# Patient Record
Sex: Male | Born: 1985
Health system: Southern US, Community
[De-identification: ages and names within clinical notes are randomized; demographics above are authoritative.]

## PROBLEM LIST (undated history)

## (undated) DIAGNOSIS — J301 Allergic rhinitis due to pollen: Secondary | ICD-10-CM

## (undated) HISTORY — DX: Allergic rhinitis due to pollen: J30.1

---

## 2006-03-02 ENCOUNTER — Emergency Department: Payer: Self-pay

## 2007-06-11 ENCOUNTER — Other Ambulatory Visit: Payer: Self-pay

## 2007-06-11 ENCOUNTER — Emergency Department: Payer: Self-pay | Admitting: Emergency Medicine

## 2007-10-12 ENCOUNTER — Emergency Department: Payer: Self-pay | Admitting: Emergency Medicine

## 2008-08-14 ENCOUNTER — Emergency Department: Payer: Self-pay | Admitting: Emergency Medicine

## 2008-12-22 ENCOUNTER — Emergency Department: Payer: Self-pay | Admitting: Internal Medicine

## 2009-12-28 ENCOUNTER — Emergency Department: Payer: Self-pay | Admitting: Internal Medicine

## 2010-01-01 ENCOUNTER — Emergency Department: Payer: Self-pay | Admitting: Emergency Medicine

## 2012-12-21 ENCOUNTER — Emergency Department: Payer: Self-pay | Admitting: Emergency Medicine

## 2013-05-15 ENCOUNTER — Emergency Department: Payer: Self-pay | Admitting: Emergency Medicine

## 2017-07-17 ENCOUNTER — Other Ambulatory Visit: Payer: Self-pay

## 2017-07-17 ENCOUNTER — Encounter: Payer: Self-pay | Admitting: Emergency Medicine

## 2017-07-17 ENCOUNTER — Emergency Department
Admission: EM | Admit: 2017-07-17 | Discharge: 2017-07-17 | Disposition: A | Discharge status: Home / Self Care | Payer: 59 | Attending: Emergency Medicine | Admitting: Emergency Medicine

## 2017-07-17 DIAGNOSIS — B354 Tinea corporis: Secondary | ICD-10-CM | POA: Diagnosis not present

## 2017-07-17 DIAGNOSIS — L0231 Cutaneous abscess of buttock: Secondary | ICD-10-CM | POA: Diagnosis not present

## 2017-07-17 DIAGNOSIS — R21 Rash and other nonspecific skin eruption: Secondary | ICD-10-CM | POA: Diagnosis present

## 2017-07-17 MED ORDER — TERBINAFINE HCL 1 % EX CREA: 1.0000 "application " | TOPICAL_CREAM | Freq: Two times a day (BID) | CUTANEOUS | 0 refills | Status: DC

## 2017-07-17 MED ORDER — SULFAMETHOXAZOLE-TRIMETHOPRIM 800-160 MG PO TABS: 1.0000 | ORAL_TABLET | Freq: Two times a day (BID) | ORAL | 0 refills | Status: DC

## 2017-07-17 MED ORDER — HYDROCODONE-ACETAMINOPHEN 5-325 MG PO TABS: 1.0000 | ORAL_TABLET | Freq: Four times a day (QID) | ORAL | 0 refills | Status: DC | PRN

## 2017-07-17 NOTE — Discharge Instructions (Signed)
Begin taking antibiotics today twice a day for the next 10 days.  Take pain medication as needed every 6 hours.  Do not take this pain medication and drive as it may cause drowsiness.  Also the Lamisil 1% cream applied to area twice a day.  It may take several weeks before you start seeing this rash begin to fade away.  If not improving you will need to see 1 of the dermatologist at [facility] for continued therapy.

## 2017-07-17 NOTE — ED Provider Notes (Signed)
[facility] Emergency Department Provider Note  ____________________________________________   First MD Initiated Contact with Patient 07/17/17 1326     (approximate)  I have reviewed the triage vital signs and the nursing notes.   HISTORY  Chief Complaint Abscess and Tinea   HPI Harry Johnson is a 32 y.o. male is here with complaint of a "ringworm" on his left wrist.  Patient states it is been there for quite a while and he has been using cortisone cream to the area without any relief.  Patient also is here to have a "boil" looked out on his buttocks.  He states this began recently and has increased in pain.  He has been using warm compresses to the area without any improvement.  He denies any fever or chills.  As his pain is a 10/10.  History reviewed. No pertinent past medical history.  There are no active problems to display for this patient.   History reviewed. No pertinent surgical history.  Prior to Admission medications   Medication Sig Start Date End Date Taking? Authorizing Provider  HYDROcodone-acetaminophen (NORCO/VICODIN) 5-325 MG tablet Take 1 tablet by mouth every 6 (six) hours as needed for moderate pain. 07/17/17   Tommi Rumps, PA-C  sulfamethoxazole-trimethoprim (BACTRIM DS,SEPTRA DS) 800-160 MG tablet Take 1 tablet by mouth 2 (two) times daily. 07/17/17   Tommi Rumps, PA-C  terbinafine (LAMISIL) 1 % cream Apply 1 application topically 2 (two) times daily. 07/17/17   Tommi Rumps, PA-C    Allergies Patient has no known allergies.  No family history on file.  Social History Social History   Tobacco Use  . Smoking status: Never Smoker  . Smokeless tobacco: Never Used  Substance Use Topics  . Alcohol use: Yes  . Drug use: Not on file    Review of Systems Constitutional: No fever/chills Cardiovascular: Denies chest pain. Respiratory: Denies shortness of breath. Gastrointestinal: No abdominal pain.  No nausea,  no vomiting.  Musculoskeletal: Negative for back pain. Skin: Positive for rash.  Positive for abscess. Neurological: Negative for headaches, focal weakness or numbness. ____________________________________________   PHYSICAL EXAM:  VITAL SIGNS: ED Triage Vitals  Enc Vitals Group     BP 07/17/17 1228 100/79     Pulse Rate 07/17/17 1228 83     Resp 07/17/17 1228 14     Temp 07/17/17 1228 98.4 F (36.9 C)     Temp Source 07/17/17 1228 Oral     SpO2 07/17/17 1228 100 %     Weight 07/17/17 1223 188 lb (85.3 kg)     Height 07/17/17 1223 5\' 8"  (1.727 m)     Head Circumference --      Peak Flow --      Pain Score 07/17/17 1223 10     Pain Loc --      Pain Edu? --      Excl. in GC? --     Constitutional: Alert and oriented. Well appearing and in no acute distress. Eyes: Conjunctivae are normal.  Head: Atraumatic. Neck: No stridor.   Cardiovascular: Normal rate, regular rhythm. Grossly normal heart sounds.  Good peripheral circulation. Respiratory: Normal respiratory effort.  No retractions. Lungs CTAB. Musculoskeletal: No lower extremity tenderness nor edema.  No joint effusions. Neurologic:  Normal speech and language. No gross focal neurologic deficits are appreciated.  Skin:  Skin is warm, dry and intact.  Left wrist volar aspect with macular area with discrete margins and central clearing consistent with  tinea corpus.  Right buttocks with erythema and a 4 cm firm tender nodule.  No drainage is present.  No area of fluctuance was noted during exam. Psychiatric: Mood and affect are normal. Speech and behavior are normal.  ____________________________________________   LABS (all labs ordered are listed, but only abnormal results are displayed)  Labs Reviewed - No data to display ____________________________________________  PROCEDURES  Procedure(s) performed: None  Procedures  Critical Care performed: No  ____________________________________________   INITIAL  IMPRESSION / ASSESSMENT AND PLAN / ED COURSE  As part of my medical decision making, I reviewed the following data within the electronic MEDICAL RECORD NUMBER Notes from prior ED visits and [place] Controlled Substance Database  Patient presents with a rash to his wrist and also a early abscess to his right buttocks.  Patient was given prescription for Lamisil cream to apply twice daily to his wrist.  He is aware that this will take several weeks before he sees improvement.  Also he was placed on antibiotics for an early abscess to his right buttocks that appears cellulitic.  He will begin doing warm compresses to the area frequently.  He was discharged with a prescription for antibiotics and he is to return to the emergency department in 1 to 2 days for possible I&D of this area.  ____________________________________________   FINAL CLINICAL IMPRESSION(S) / ED DIAGNOSES  Final diagnoses:  Abscess of buttock, right  Tinea corporis     ED Discharge Orders        Ordered    sulfamethoxazole-trimethoprim (BACTRIM DS,SEPTRA DS) 800-160 MG tablet  2 times daily     07/17/17 1351    HYDROcodone-acetaminophen (NORCO/VICODIN) 5-325 MG tablet  Every 6 hours PRN     07/17/17 1351    terbinafine (LAMISIL) 1 % cream  2 times daily     07/17/17 1351       Note:  This document was prepared using Dragon voice recognition software and may include unintentional dictation errors.    Tommi RumpsSummers, Benjamen Koelling L, PA-C 07/17/17 1627    Dionne BucySiadecki, Sebastian, MD 07/18/17 [phone]

## 2017-07-17 NOTE — ED Triage Notes (Signed)
Says ringworm on left wrist.  And he thinks he has a boil on his bottom.

## 2017-07-17 NOTE — ED Notes (Signed)
See triage note  Presents with possible ringworm to wrist area  No pain  Also having some pain to buttocks  Possible abscess area  Ambulates well treatment room

## 2017-08-28 ENCOUNTER — Other Ambulatory Visit: Payer: Self-pay

## 2017-08-28 ENCOUNTER — Emergency Department
Admission: EM | Admit: 2017-08-28 | Discharge: 2017-08-28 | Disposition: A | Discharge status: Home / Self Care | Payer: 59 | Attending: Emergency Medicine | Admitting: Emergency Medicine

## 2017-08-28 ENCOUNTER — Encounter: Payer: Self-pay | Admitting: Emergency Medicine

## 2017-08-28 DIAGNOSIS — Z79899 Other long term (current) drug therapy: Secondary | ICD-10-CM | POA: Insufficient documentation

## 2017-08-28 DIAGNOSIS — W2210XA Striking against or struck by unspecified automobile airbag, initial encounter: Secondary | ICD-10-CM | POA: Insufficient documentation

## 2017-08-28 DIAGNOSIS — Y999 Unspecified external cause status: Secondary | ICD-10-CM | POA: Diagnosis not present

## 2017-08-28 DIAGNOSIS — Y939 Activity, unspecified: Secondary | ICD-10-CM | POA: Diagnosis not present

## 2017-08-28 DIAGNOSIS — S50812A Abrasion of left forearm, initial encounter: Secondary | ICD-10-CM | POA: Diagnosis not present

## 2017-08-28 DIAGNOSIS — Y929 Unspecified place or not applicable: Secondary | ICD-10-CM | POA: Insufficient documentation

## 2017-08-28 DIAGNOSIS — S59912A Unspecified injury of left forearm, initial encounter: Secondary | ICD-10-CM | POA: Diagnosis present

## 2017-08-28 MED ORDER — MELOXICAM 15 MG PO TABS: 15.0000 mg | ORAL_TABLET | Freq: Every day | ORAL | 0 refills | Status: DC

## 2017-08-28 MED ORDER — CEPHALEXIN 500 MG PO CAPS: 1000.0000 mg | ORAL_CAPSULE | Freq: Two times a day (BID) | ORAL | 0 refills | Status: DC

## 2017-08-28 MED ORDER — METHOCARBAMOL 500 MG PO TABS: 500.0000 mg | ORAL_TABLET | Freq: Four times a day (QID) | ORAL | 0 refills | Status: DC
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
## 2017-12-29 ENCOUNTER — Other Ambulatory Visit: Payer: Self-pay

## 2017-12-29 ENCOUNTER — Encounter: Payer: Self-pay | Admitting: Intensive Care

## 2017-12-29 ENCOUNTER — Emergency Department
Admission: EM | Admit: 2017-12-29 | Discharge: 2017-12-29 | Disposition: A | Discharge status: Home / Self Care | Payer: 59 | Attending: Emergency Medicine | Admitting: Emergency Medicine

## 2017-12-29 DIAGNOSIS — B349 Viral infection, unspecified: Secondary | ICD-10-CM | POA: Insufficient documentation

## 2017-12-29 DIAGNOSIS — Z79899 Other long term (current) drug therapy: Secondary | ICD-10-CM | POA: Diagnosis not present

## 2017-12-29 DIAGNOSIS — R509 Fever, unspecified: Secondary | ICD-10-CM | POA: Diagnosis present

## 2017-12-29 MED ORDER — OSELTAMIVIR PHOSPHATE 75 MG PO CAPS: 75.0000 mg | ORAL_CAPSULE | Freq: Two times a day (BID) | ORAL | 0 refills | Status: AC

## 2017-12-29 NOTE — Discharge Instructions (Addendum)
Please continue with over-the-counter medications such as Alka-Seltzer to help treat cough, congestion, runny nose.  Take Tamiflu as prescribed.  Make sure you are drinking lots of fluids.  Alternate Tylenol and ibuprofen as needed for body aches and fevers.

## 2017-12-29 NOTE — ED Notes (Signed)
Patient reports feeling very tired and having lower back pain that began yesterday.  Patient states he had a fever and has been having body aches.  Patient states his daughter had the flu.  Patient is in no obvious distress at this time.

## 2017-12-29 NOTE — ED Triage Notes (Signed)
Patient c/o chills/sweats, congestion, and body aches. A&O x4

## 2017-12-29 NOTE — ED Provider Notes (Signed)
[facility] EMERGENCY DEPARTMENT Provider Note   CSN: [id number] Arrival date & time: 12/29/17  1633     History   Chief Complaint No chief complaint on file.   HPI Harry Johnson is a 32 y.o. male.  Presents emergency department evaluation of body aches, fever, cough congestion runny nose.  He denies a sore throat.  Daughter was recently diagnosed with influenza and started on Tamiflu.  He denies any abdominal pain, nausea, vomiting, diarrhea chest pain shortness of breath.  Patient is taken some Alka-Seltzer with mild improvement of body aches.  He denies any urinary symptoms.  HPI  History reviewed. No pertinent past medical history.  There are no active problems to display for this patient.   History reviewed. No pertinent surgical history.      Home Medications    Prior to Admission medications   Medication Sig Start Date End Date Taking? Authorizing Provider  cephALEXin (KEFLEX) 500 MG capsule Take 2 capsules (1,000 mg total) by mouth 2 (two) times daily. 08/28/17   Cuthriell, Delorise RoyalsJonathan D, PA-C  HYDROcodone-acetaminophen (NORCO/VICODIN) 5-325 MG tablet Take 1 tablet by mouth every 6 (six) hours as needed for moderate pain. 07/17/17   Tommi RumpsSummers, Rhonda L, PA-C  meloxicam (MOBIC) 15 MG tablet Take 1 tablet (15 mg total) by mouth daily. 08/28/17   Cuthriell, Delorise RoyalsJonathan D, PA-C  methocarbamol (ROBAXIN) 500 MG tablet Take 1 tablet (500 mg total) by mouth 4 (four) times daily. 08/28/17   Cuthriell, Delorise RoyalsJonathan D, PA-C  oseltamivir (TAMIFLU) 75 MG capsule Take 1 capsule (75 mg total) by mouth 2 (two) times daily for 5 days. 12/29/17 01/03/18  Evon SlackGaines, Josiane Labine C, PA-C  sulfamethoxazole-trimethoprim (BACTRIM DS,SEPTRA DS) 800-160 MG tablet Take 1 tablet by mouth 2 (two) times daily. 07/17/17   Tommi RumpsSummers, Rhonda L, PA-C  terbinafine (LAMISIL) 1 % cream Apply 1 application topically 2 (two) times daily. 07/17/17   Tommi RumpsSummers, Rhonda L, PA-C    Family History History reviewed.  No pertinent family history.  Social History Social History   Tobacco Use  . Smoking status: Never Smoker  . Smokeless tobacco: Never Used  Substance Use Topics  . Alcohol use: Yes  . Drug use: Never     Allergies   Patient has no known allergies.   Review of Systems Review of Systems  Constitutional: Positive for chills and fever.  HENT: Positive for congestion, rhinorrhea and sore throat. Negative for sinus pressure and sinus pain.   Respiratory: Positive for cough. Negative for wheezing and stridor.   Gastrointestinal: Negative for diarrhea, nausea and vomiting.  Genitourinary: Negative for difficulty urinating, discharge and dysuria.  Musculoskeletal: Positive for myalgias. Negative for arthralgias and neck stiffness.  Skin: Negative for rash.  Neurological: Negative for dizziness.     Physical Exam Updated Vital Signs BP 134/64 (BP Location: Left Arm)   Pulse 90   Temp 99.8 F (37.7 C) (Oral)   Resp 14   Ht 5\' 8"  (1.727 m)   Wt 83.9 kg   SpO2 99%   BMI 28.13 kg/m   Physical Exam Constitutional:      General: He is not in acute distress.    Appearance: He is well-developed.  HENT:     Head: Normocephalic and atraumatic.     Jaw: No trismus.     Right Ear: Hearing, tympanic membrane, ear canal and external ear normal.     Left Ear: Hearing, tympanic membrane, ear canal and external ear normal.     Nose:  Rhinorrhea present.     Right Sinus: No maxillary sinus tenderness or frontal sinus tenderness.     Left Sinus: No maxillary sinus tenderness or frontal sinus tenderness.     Mouth/Throat:     Pharynx: No oropharyngeal exudate, posterior oropharyngeal erythema or uvula swelling.     Tonsils: No tonsillar abscesses.  Eyes:     Conjunctiva/sclera: Conjunctivae normal.  Neck:     Musculoskeletal: Normal range of motion.  Cardiovascular:     Rate and Rhythm: Normal rate and regular rhythm.  Pulmonary:     Effort: No respiratory distress.     Breath  sounds: No stridor. No wheezing.  Chest:     Chest wall: No tenderness.  Abdominal:     General: There is no distension.     Palpations: Abdomen is soft.     Tenderness: There is no abdominal tenderness. There is no guarding.  Musculoskeletal: Normal range of motion.  Skin:    General: Skin is warm and dry.     Findings: No rash.  Neurological:     Mental Status: He is alert and oriented to person, place, and time.  Psychiatric:        Behavior: Behavior normal.        Thought Content: Thought content normal.        Judgment: Judgment normal.      ED Treatments / Results  Labs (all labs ordered are listed, but only abnormal results are displayed) Labs Reviewed - No data to display  EKG None  Radiology No results found.  Procedures Procedures (including critical care time)  Medications Ordered in ED Medications - No data to display   Initial Impression / Assessment and Plan / ED Course  I have reviewed the triage vital signs and the nursing notes.  Pertinent labs & imaging results that were available during my care of the patient were reviewed by me and considered in my medical decision making (see chart for details).     32 year old male with influenza-like symptoms.  Daughter was diagnosed with influenza and started on Tamiflu.  Patient's physical exam and history consistent with flulike illness, he forego testing and was okay with starting Tamiflu.  He will continue with over-the-counter medications for symptomatic relief.  Continue with Tylenol and ibuprofen increase fluids.  He understands signs and symptoms return to ED for.  Final Clinical Impressions(s) / ED Diagnoses   Final diagnoses:  Viral illness    ED Discharge Orders         Ordered    oseltamivir (TAMIFLU) 75 MG capsule  2 times daily     12/29/17 1815           Ronnette Juniper 12/29/17 1817    Jeanmarie Plant, MD 12/29/17 2231

## 2018-05-21 ENCOUNTER — Encounter: Payer: Self-pay | Admitting: Internal Medicine

## 2018-05-21 ENCOUNTER — Ambulatory Visit (INDEPENDENT_AMBULATORY_CARE_PROVIDER_SITE_OTHER): Payer: BLUE CROSS/BLUE SHIELD | Admitting: Internal Medicine

## 2018-05-21 ENCOUNTER — Ambulatory Visit: Payer: Managed Care, Other (non HMO)

## 2018-05-21 VITALS — BP 118/80 | Ht 68.0 in | Wt 194.0 lb

## 2018-05-21 DIAGNOSIS — R5383 Other fatigue: Secondary | ICD-10-CM

## 2018-05-21 DIAGNOSIS — F439 Reaction to severe stress, unspecified: Secondary | ICD-10-CM | POA: Diagnosis not present

## 2018-05-21 DIAGNOSIS — J301 Allergic rhinitis due to pollen: Secondary | ICD-10-CM | POA: Insufficient documentation

## 2018-05-21 NOTE — Assessment & Plan Note (Signed)
Not sure if this is serious or not No weight loss, fevers or other worrisome symptoms Will hold off on labs---but check if he worsens

## 2018-05-21 NOTE — Progress Notes (Signed)
Subjective:    Patient ID: Harry Johnson, male    DOB: April 04, 1985, 33 y.o.   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 Some attempts at social distancing--but imperfect. No clear close contact with cases He is stressed by this--has offer from his work to get "COVID time" He is especially concerned due to his 32 year old daughter --was preemie Hopes after some time off--he will be able to work from home He is wearing gloves, mask, etc when he is out--but can't use them at work due to phone set he wears No COVID symptoms  Current Outpatient Medications on File Prior to Visit  Medication Sig Dispense Refill  . cetirizine (ZYRTEC) 10 MG tablet Take 10 mg by mouth daily.     No current facility-administered medications on file prior to visit.     No Known Allergies  Past Medical History:  Diagnosis Date  . Chronic seasonal allergic rhinitis due to pollen     History reviewed. No pertinent surgical history.  Family History  Problem Relation Age of Onset  . Lung disease Mother   . Cancer Neg Hx   . Diabetes Neg Hx   . Heart disease Neg Hx     Social History   Socioeconomic History  . Marital status: Married    Spouse name: Not on file  . Number of children: 1  . Years of education: Not on file  . Highest education level: Not on file  Occupational History  . 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 Financial resource strain: Not on file  . Food insecurity:    Worry: Not on file    Inability: Not on file  . Transportation needs:    Medical: Not on file    Non-medical: Not  on file  Tobacco Use  . Smoking status: Never Smoker  . Smokeless tobacco: Never Used  Substance and Sexual Activity  . Alcohol use: Yes  . Drug use: Never  . Sexual activity: Not on file  Lifestyle  . Physical activity:    Days per week: Not on file    Minutes per session: Not on file  . Stress: Not on file  Relationships  . Social connections:    Talks on phone: Not on file    Gets together: Not on file    Attends religious service: Not on file    Active member of club or organization: Not on file    Attends meetings of clubs or organizations: Not on file    Relationship status: Not on file  . Intimate partner violence:    Fear of current or ex partner: Not on file    Emotionally abused: Not on file    Physically abused: Not on file    Forced sexual activity: Not on file  Other Topics Concern  . Not on file  Social History Narrative   Married    32 year old daughter   32 year old step daughter   Review of Systems  Constitutional: Positive for fatigue. Negative for unexpected weight change.  Feels he wears out quicker  HENT: Negative for dental problem and hearing loss.   Respiratory: Negative for cough, chest tightness and shortness of breath.   Cardiovascular: Negative for chest pain, palpitations and leg swelling.  Gastrointestinal: Negative for abdominal pain and constipation.       Appetite is good No heartburn  Genitourinary: Negative for difficulty urinating, frequency and urgency.  Musculoskeletal: Negative for arthralgias, back pain and joint swelling.  Skin: Negative for rash.  Allergic/Immunologic: Positive for environmental allergies. Negative for immunocompromised state.  Neurological: Negative for dizziness, syncope, light-headedness and headaches.  Hematological: Negative for adenopathy. Does not bruise/bleed easily.  Psychiatric/Behavioral: Negative for dysphoric mood and sleep disturbance. The patient is not nervous/anxious.        Objective:    Physical Exam  Constitutional: He appears well-developed. No distress.  Respiratory: Effort normal. No respiratory distress.  Psychiatric: He has a normal mood and affect. His behavior is normal.           Assessment & Plan:

## 2018-05-21 NOTE — Assessment & Plan Note (Signed)
There has been COVID at his office and he is very worried about bringing it home to his formerly preemie daughter He is allowed COVID time through his job---- I will write letter suggesting 2-3 weeks off from the office and hope that he can transition to working from home soon. He will get back to me with specifics about the letter

## 2018-05-22 ENCOUNTER — Encounter: Payer: Self-pay | Admitting: Internal Medicine

## 2018-05-22 NOTE — Telephone Encounter (Signed)
Please find out if he wants this mailed or if he will pick it up No charge

## 2018-06-10 NOTE — Telephone Encounter (Signed)
You need to send an in basket (staff message) to the [facility] COMMUNITY TESTING POOL.    *They do require that patient has had a virtual visit prior to being set up for scheduling.   You will need to add this pool in order to select it from the send menu.  (if you do not know how to do this, please see me tomorrow and I will show you how to add this to your computer).   Once selecting this pool, you need to enter in the patient's name, MRN, DOB, reason for COVID testing, insurance name and policy number and eligibility. And send.  The [facility] pool will then take the info, put in the physician order and call the patient to schedule a site location and appt time.   Lastly,   You only need to order the my chart companion for home monitoring and be sure the patient knows to quarantine.   Please see me tomorrow if you need any help navigating this.   Thanks.

## 2018-06-10 NOTE — Telephone Encounter (Signed)
Mandy, do you know what he needs to do?

## 2018-06-10 NOTE — Telephone Encounter (Signed)
I am having trouble figuring out how to order the outpatient testing for COVID (like at the [facility] at [facility]) The flow sheet suggests that it has to go through the [facility]. Can you figure this out for me---I thought I could just order it

## 2018-06-17 NOTE — Telephone Encounter (Signed)
I had recommended the [facility] testing at [facility] and he said he was going to try that. I haven't heard from him since. It might be good to follow up to see if he went

## 2018-06-17 NOTE — Telephone Encounter (Signed)
Just following up, do we still need to get this patient scheduled for testing?

## 2018-06-22 NOTE — Telephone Encounter (Signed)
Spoke with patient on phone to follow up on his health status.    He states that he never was able to be tested but symptoms have fully resolved and he is doing fine now.  He thanks [place] for the call and will call us back if he needs anything further.

## 2018-07-07 ENCOUNTER — Encounter: Payer: Self-pay | Admitting: Internal Medicine

## 2018-07-07 ENCOUNTER — Other Ambulatory Visit: Payer: Self-pay

## 2018-07-07 ENCOUNTER — Ambulatory Visit (INDEPENDENT_AMBULATORY_CARE_PROVIDER_SITE_OTHER): Payer: BC Managed Care – PPO | Admitting: Internal Medicine

## 2018-07-07 VITALS — BP 112/78 | HR 72 | Temp 98.5°F | Ht 68.0 in | Wt 198.0 lb

## 2018-07-07 DIAGNOSIS — R5383 Other fatigue: Secondary | ICD-10-CM | POA: Diagnosis not present

## 2018-07-07 DIAGNOSIS — R0602 Shortness of breath: Secondary | ICD-10-CM

## 2018-07-07 LAB — CBC
HCT: 42 % (ref 39.0–52.0)
Hemoglobin: 14 g/dL (ref 13.0–17.0)
MCHC: 33.4 g/dL (ref 30.0–36.0)
MCV: 83.7 fl (ref 78.0–100.0)
Platelets: 269 10*3/uL (ref 150.0–400.0)
RBC: 5.01 Mil/uL (ref 4.22–5.81)
RDW: 14.2 % (ref 11.5–15.5)
WBC: 4.7 10*3/uL (ref 4.0–10.5)

## 2018-07-07 LAB — T4, FREE: Free T4: 0.66 ng/dL (ref 0.60–1.60)

## 2018-07-07 LAB — COMPREHENSIVE METABOLIC PANEL
ALT: 26 U/L (ref 0–53)
AST: 20 U/L (ref 0–37)
Albumin: 4.4 g/dL (ref 3.5–5.2)
Alkaline Phosphatase: 56 U/L (ref 39–117)
BUN: 14 mg/dL (ref 6–23)
CO2: 29 mEq/L (ref 19–32)
Calcium: 9.2 mg/dL (ref 8.4–10.5)
Chloride: 102 mEq/L (ref 96–112)
Creatinine, Ser: 1.22 mg/dL (ref 0.40–1.50)
GFR: 83.38 mL/min (ref >60.00)
Glucose, Bld: 82 mg/dL (ref 70–99)
Potassium: 4.1 mEq/L (ref 3.5–5.1)
Sodium: 136 mEq/L (ref 135–145)
Total Bilirubin: 0.4 mg/dL (ref 0.2–1.2)
Total Protein: 6.8 g/dL (ref 6.0–8.3)

## 2018-07-07 LAB — SEDIMENTATION RATE: Sed Rate: 3 mm/hr (ref 0–15)

## 2018-07-07 NOTE — Patient Instructions (Signed)
Please try miralax --a full capful in a big glass of water--- daily. Also add senna-s 2 tabs daily or twice a day. These will take a few days to really start getting you more regular.

## 2018-07-07 NOTE — Assessment & Plan Note (Signed)
Ongoing issues but vague No clear worrisome features on history Will check labs

## 2018-07-07 NOTE — Progress Notes (Signed)
Subjective:    Patient ID: Harry Johnson, male    DOB: 1985-08-08, 33 y.o.   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 (but they do maintain 6 feet of distance) He has been offered "COVID time" There have been positive cases in his building---this has made him very anxious  Has slowed down on his working out Still fatigued--gets SOB at times This has happened when working out--but also with his nerves Does seem to notice it more at work Will have to step away from his desk--and deep breath to get it back under control Generally works out 45 minutes straight--- will need to slow down after 15 minutes and then can ramp it back up  Stomach continues to be bloated Has "changed up my diet" Some better but still persists to some degree  Current Outpatient Medications on File Prior to Visit  Medication Sig Dispense Refill  . cetirizine (ZYRTEC) 10 MG tablet Take 10 mg by mouth daily.     No current facility-administered medications on file prior to visit.     No Known Allergies  Past Medical History:  Diagnosis Date  . Chronic seasonal allergic rhinitis due to pollen     History reviewed. No pertinent surgical history.  Family History  Problem Relation Age of Onset  . Sarcoidosis Mother   . Cancer Neg Hx   . Diabetes Neg Hx   . Heart disease Neg Hx     Social History   Socioeconomic History  . Marital status: Married    Spouse name: Not on file  . Number of children: 1  . Years of education: Not on file  . Highest education level: Not on file  Occupational History  . 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 Financial resource strain: Not on file  . Food insecurity    Worry: Not on file    Inability: Not on file  . Transportation needs    Medical: Not on file    Non-medical: Not on file   Tobacco Use  . Smoking status: Never Smoker  . Smokeless tobacco: Never Used  Substance and Sexual Activity  . Alcohol use: Yes  . Drug use: Never  . Sexual activity: Not on file  Lifestyle  . Physical activity    Days per week: Not on file    Minutes per session: Not on file  . Stress: Not on file  Relationships  . Social Musicianconnections    Talks on phone: Not on file    Gets together: Not on file    Attends religious service: Not on file    Active member of club or organization: Not on file    Attends meetings of clubs or organizations: Not on file    Relationship status: Not on file  . Intimate partner violence    Fear of current or ex partner: Not on file    Emotionally abused: Not on file    Physically abused: Not on file    Forced sexual activity: Not on file  Other Topics Concern  . Not on file  Social History Narrative   Married    32 year old daughter   32 year old step daughter   Review of Systems No recent chest pain Appetite is decent Weight is up some Not sleeping great---awakens most  nights but able to get back to sleep eventually Some AM tiredness--not always No depression or anhedonia No tobacco products No history of asthma or other lung disease Uses the zyrtec for allergies Having ongoing constipation--discussed    Objective:   Physical Exam  Constitutional: He is oriented to person, place, and time. He appears well-developed. No distress.  HENT:  Mouth/Throat: Oropharynx is clear and moist. No oropharyngeal exudate.  Neck: No thyromegaly present.  Cardiovascular: Normal rate, regular rhythm, normal heart sounds and intact distal pulses. Exam reveals no gallop.  No murmur heard. Respiratory: Effort normal and breath sounds normal. No respiratory distress. He has no wheezes. He has no rales.  GI: Soft. There is no abdominal tenderness.  Musculoskeletal:        General: No tenderness or edema.  Lymphadenopathy:    He has no cervical adenopathy.   Neurological: He is alert and oriented to person, place, and time.  Skin: No rash noted. No erythema.  Psychiatric: He has a normal mood and affect. His behavior is normal.           Assessment & Plan:

## 2018-07-07 NOTE — Assessment & Plan Note (Signed)
Vague symptoms Mostly at work related to stress and anxiety----sounds like he is describing a mild panic attack Some change in exercise tolerance but nothing to suggest pulmonary (no cough, fever, etc) or cardiac etiology Will write letter for time off again

## 2018-07-08 ENCOUNTER — Telehealth: Payer: Self-pay | Admitting: Internal Medicine

## 2018-07-08 NOTE — Telephone Encounter (Signed)
[name], who the HR with patients employer [employer] received patient's work note excusing him from work for 14 days,   She has some questions about the work note and would like a call back to get clarification .   The letter stated that he is out of work for 14 days due to on going symptoms. She stated that patient presented a letter like this about a month ago stating the same thing. . She stated that if this is related to COVID symptoms then they would like to know that a test was preformed on the patient   C/B # [phone]

## 2018-07-09 NOTE — Telephone Encounter (Signed)
Spoke to pt. He said it was okay to speak to them.

## 2018-07-09 NOTE — Telephone Encounter (Signed)
Please call patient.  I have been asked to call his HR and clarify the note. I will have to tell them it is related to his anxiety, etc. They do not appear to be satisfied that he can just get a note to be out for COVID without symptoms of COVID.

## 2018-07-09 NOTE — Telephone Encounter (Signed)
I left a detailed message on her identified voice mail

## 2018-12-08 ENCOUNTER — Other Ambulatory Visit: Payer: Self-pay

## 2018-12-08 ENCOUNTER — Ambulatory Visit (INDEPENDENT_AMBULATORY_CARE_PROVIDER_SITE_OTHER): Payer: Managed Care, Other (non HMO) | Admitting: Internal Medicine

## 2018-12-08 ENCOUNTER — Encounter: Payer: Self-pay | Admitting: Internal Medicine

## 2018-12-08 DIAGNOSIS — Z0000 Encounter for general adult medical examination without abnormal findings: Secondary | ICD-10-CM | POA: Diagnosis not present

## 2018-12-08 DIAGNOSIS — K5900 Constipation, unspecified: Secondary | ICD-10-CM | POA: Diagnosis not present

## 2018-12-08 MED ORDER — LINACLOTIDE 145 MCG PO CAPS: 145.0000 ug | ORAL_CAPSULE | Freq: Every day | ORAL | 11 refills | Status: DC

## 2018-12-08 NOTE — Assessment & Plan Note (Signed)
Will try linzess if affordable

## 2018-12-08 NOTE — Progress Notes (Signed)
Subjective:    Patient ID: ARLEE SANTOSUOSSO, male    DOB: 07/22/1985, 33 y.o.   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 cetirizine (ZYRTEC) 10 MG tablet Take 10 mg by mouth daily.     No current facility-administered medications on file prior to visit.     No Known Allergies  Past Medical History:  Diagnosis Date  . Chronic seasonal allergic rhinitis due to pollen     History reviewed. No pertinent surgical history.  Family History  Problem Relation Age of Onset  . Sarcoidosis Mother   . Cancer Neg Hx   . Diabetes Neg Hx   . Heart disease Neg Hx     Social History   Socioeconomic History  . Marital status: Married    Spouse name: Not on file  . Number of children: 1  . Years of education: Not on file  . Highest education level: Not on file  Occupational History  . Occupation: Event organiser: [employer]    Comment:    Social Needs  . Financial resource strain: Not on file  . Food insecurity    Worry: Not on file    Inability: Not on file  . Transportation needs    Medical: Not on file    Non-medical: Not on file  Tobacco Use  . Smoking status: Never Smoker  . Smokeless tobacco: Never Used  Substance and Sexual Activity  . Alcohol use: Yes  . Drug use: Never  . Sexual activity: Not on file  Lifestyle  . Physical activity    Days per week: Not on file    Minutes per session: Not on file  . Stress: Not on file  Relationships  . Social Musician on phone: Not on file    Gets together: Not on file    Attends religious service: Not on file    Active member of club or organization: Not on file    Attends meetings of clubs or organizations: Not on file    Relationship status: Not on file  . Intimate partner  violence    Fear of current or ex partner: Not on file    Emotionally abused: Not on file    Physically abused: Not on file    Forced sexual activity: Not on file  Other Topics Concern  . Not on file  Social History Narrative   Married    10 year old daughter   69 year old step daughter   Review of Systems  Constitutional: Negative for fatigue and unexpected weight change.       Wears seat belt  HENT: Negative for hearing loss, tinnitus and trouble swallowing.        Has had some extractions  Eyes: Negative for visual disturbance.       No diplopia or unilateral vision loss  Respiratory: Negative for cough, chest tightness and shortness of breath.   Cardiovascular: Negative for chest pain, palpitations and leg swelling.  Gastrointestinal: Negative for abdominal pain and blood in stool.       No heartburn or dysphagia  Endocrine: Negative for polydipsia and polyuria.  Genitourinary: Negative for difficulty urinating and urgency.       No sexual problems  Musculoskeletal: Negative  for arthralgias, back pain and joint swelling.  Skin: Negative for rash.       No suspicious skin lesions  Allergic/Immunologic: Negative for environmental allergies and immunocompromised state.  Neurological: Negative for dizziness, syncope, light-headedness and headaches.  Hematological: Negative for adenopathy. Does not bruise/bleed easily.  Psychiatric/Behavioral: Negative for dysphoric mood. The patient is not nervous/anxious.        Sleep is variable---working nights       Objective:   Physical Exam  Constitutional: He is oriented to person, place, and time. He appears well-developed. No distress.  HENT:  Head: Normocephalic and atraumatic.  Right Ear: External ear normal.  Left Ear: External ear normal.  Mouth/Throat: Oropharynx is clear and moist.  Eyes: Pupils are equal, round, and reactive to light. Conjunctivae are normal.  Neck: No thyromegaly present.  Cardiovascular: Normal rate,  regular rhythm, normal heart sounds and intact distal pulses. Exam reveals no gallop.  No murmur heard. Respiratory: Effort normal and breath sounds normal. No respiratory distress. He has no wheezes. He has no rales.  GI: Soft. There is no abdominal tenderness.  Musculoskeletal:        General: No tenderness or edema.  Lymphadenopathy:    He has no cervical adenopathy.  Neurological: He is alert and oriented to person, place, and time.  Skin: No rash noted. No erythema.  Psychiatric: He has a normal mood and affect. His behavior is normal.           Assessment & Plan:

## 2018-12-08 NOTE — Assessment & Plan Note (Signed)
Healthy Discussed fitness, healthy eating, etc Prefers no flu vaccine

## 2019-07-07 ENCOUNTER — Other Ambulatory Visit: Payer: Self-pay

## 2019-07-08 ENCOUNTER — Ambulatory Visit (INDEPENDENT_AMBULATORY_CARE_PROVIDER_SITE_OTHER): Payer: Self-pay | Admitting: Internal Medicine

## 2019-07-08 ENCOUNTER — Encounter: Payer: Self-pay | Admitting: Internal Medicine

## 2019-07-08 ENCOUNTER — Other Ambulatory Visit: Payer: Self-pay

## 2019-07-08 DIAGNOSIS — F329 Major depressive disorder, single episode, unspecified: Secondary | ICD-10-CM

## 2019-07-08 MED ORDER — MIRTAZAPINE 15 MG PO TABS: 15.0000 mg | ORAL_TABLET | Freq: Every day | ORAL | 1 refills | Status: DC
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
## 2019-07-08 NOTE — Assessment & Plan Note (Signed)
Clearly reactive so not MDD Has appt with counselor (who has done marriage counseling for him in the past). Could look into EAP at his work or I can make referral if needed Not sleeping well---- will try mirtazapine 15mg  Follow up soon

## 2019-07-08 NOTE — Patient Instructions (Signed)
I think it is most important that you set up with a counselor to go over everything that is happening. If it doesn't work out with Photographer, you can check to see if your company has an Music therapist, or I can help you. We will try the medication to see if it helps you sleep.

## 2019-07-29 ENCOUNTER — Ambulatory Visit: Payer: Managed Care, Other (non HMO) | Admitting: Internal Medicine

## 2019-07-29 DIAGNOSIS — Z0289 Encounter for other administrative examinations: Secondary | ICD-10-CM

## 2019-08-04 ENCOUNTER — Other Ambulatory Visit: Payer: Self-pay | Admitting: Internal Medicine

## 2019-09-25 ENCOUNTER — Emergency Department (HOSPITAL_COMMUNITY): Admission: EM | Admit: 2019-09-25 | Discharge: 2019-09-25 | Discharge status: Against Medical Advice | Payer: Self-pay

## 2019-09-25 ENCOUNTER — Other Ambulatory Visit: Payer: Self-pay

## 2020-06-28 ENCOUNTER — Emergency Department (HOSPITAL_COMMUNITY)
Admission: EM | Admit: 2020-06-28 | Discharge: 2020-06-28 | Disposition: A | Discharge status: Home / Self Care | Payer: Self-pay | Attending: Student | Admitting: Student

## 2020-06-28 ENCOUNTER — Encounter (HOSPITAL_COMMUNITY): Payer: Self-pay | Admitting: *Deleted

## 2020-06-28 DIAGNOSIS — R059 Cough, unspecified: Secondary | ICD-10-CM | POA: Insufficient documentation

## 2020-06-28 DIAGNOSIS — J029 Acute pharyngitis, unspecified: Secondary | ICD-10-CM | POA: Insufficient documentation

## 2020-06-28 DIAGNOSIS — Z5321 Procedure and treatment not carried out due to patient leaving prior to being seen by health care provider: Secondary | ICD-10-CM | POA: Insufficient documentation

## 2020-06-28 NOTE — ED Notes (Signed)
Pt called for vital recheck no response. 

## 2020-06-28 NOTE — ED Triage Notes (Signed)
Pt complains of sore throat x 2 days. No fever. Occasional cough.

## 2020-06-28 NOTE — ED Notes (Signed)
Call pt for recheck vitals and didn't responed

## 2020-06-28 NOTE — ED Notes (Signed)
Pt called no response

## 2020-06-28 NOTE — ED Notes (Signed)
Pt called x3 for repeat vitals; no response.

## 2020-08-23 ENCOUNTER — Other Ambulatory Visit: Payer: Self-pay

## 2020-08-23 ENCOUNTER — Encounter: Payer: Self-pay | Admitting: Internal Medicine

## 2020-08-23 ENCOUNTER — Ambulatory Visit (INDEPENDENT_AMBULATORY_CARE_PROVIDER_SITE_OTHER): Payer: Self-pay | Admitting: Internal Medicine

## 2020-08-23 VITALS — BP 120/84 | HR 70 | Temp 97.7°F | Ht 68.0 in | Wt 198.0 lb

## 2020-08-23 DIAGNOSIS — Z202 Contact with and (suspected) exposure to infections with a predominantly sexual mode of transmission: Secondary | ICD-10-CM | POA: Insufficient documentation

## 2020-08-23 MED ORDER — DOXYCYCLINE HYCLATE 100 MG PO TABS: 100.0000 mg | ORAL_TABLET | Freq: Two times a day (BID) | ORAL | 0 refills | Status: DC

## 2020-08-23 NOTE — Assessment & Plan Note (Signed)
He was monogamous for a year with someone who was unfaithful She was diagnosed with chlamydia---so I will treat him with doxy 100 bid x 7 days Do testing

## 2020-08-23 NOTE — Progress Notes (Signed)
   Subjective:    Patient ID: Harry Johnson, male    DOB: [birth year], 35 y.o.   MRN: [id number]  HPI Here because he would like to get routine STD testing This visit occurred during the SARS-CoV-2 public health emergency.  Safety protocols were in place, including screening questions prior to the visit, additional usage of staff PPE, and extensive cleaning of exam room while observing appropriate contact time as indicated for disinfecting solutions.   Had sex without condom with woman about 2 weeks ago She was his only sexual contact After she told him after that she had Chlamydia (not monogamous) Was treated later --but not before their sex No sex since then  No urethral discharge, dysuria,etc  No current outpatient medications on file prior to visit.   No current facility-administered medications on file prior to visit.    No Known Allergies  Past Medical History:  Diagnosis Date   Chronic seasonal allergic rhinitis due to pollen     History reviewed. No pertinent surgical history.  Family History  Problem Relation Age of Onset   Sarcoidosis Mother    Cancer Neg Hx    Diabetes Neg Hx    Heart disease Neg Hx     Social History   Socioeconomic History   Marital status: Divorced    Spouse name: Not on file   Number of children: 1   Years of education: Not on file   Highest education level: Not on file  Occupational History   Occupation: Tour manager: [employer]    Comment: nights  Tobacco Use   Smoking status: Never   Smokeless tobacco: Never  Vaping Use   Vaping Use: Never used  Substance and Sexual Activity   Alcohol use: Yes   Drug use: Never   Sexual activity: Not on file  Other Topics Concern   Not on file  Social History Narrative   Married --now separated   Daughter born 2019   Much older step daughter   Social Determinants of Health   Financial Resource Strain: Not on file  Food Insecurity: Not on file  Transportation Needs: Not on file   Physical Activity: Not on file  Stress: Not on file  Social Connections: Not on file  Intimate Partner Violence: Not on file   Review of Systems Has gained some weight    Objective:   Physical Exam Constitutional:      Appearance: Normal appearance.  Neurological:     Mental Status: He is alert.  Psychiatric:        Mood and Affect: Mood normal.        Behavior: Behavior normal.           Assessment & Plan:

## 2020-08-24 LAB — RPR: RPR Ser Ql: NONREACTIVE

## 2020-08-24 LAB — HIV ANTIBODY (ROUTINE TESTING W REFLEX): HIV 1&2 Ab, 4th Generation: NONREACTIVE

## 2020-08-24 LAB — C. TRACHOMATIS/N. GONORRHOEAE RNA
C. trachomatis RNA, TMA: NOT DETECTED
N. gonorrhoeae RNA, TMA: NOT DETECTED

## 2020-10-09 ENCOUNTER — Ambulatory Visit: Payer: Self-pay | Admitting: Internal Medicine

## 2020-12-27 ENCOUNTER — Ambulatory Visit: Payer: Self-pay | Admitting: Internal Medicine

## 2021-02-03 ENCOUNTER — Emergency Department (HOSPITAL_COMMUNITY)
Admission: EM | Admit: 2021-02-03 | Discharge: 2021-02-03 | Disposition: A | Discharge status: Home / Self Care | Payer: Self-pay | Attending: Emergency Medicine | Admitting: Emergency Medicine

## 2021-02-03 ENCOUNTER — Emergency Department (HOSPITAL_COMMUNITY): Payer: Self-pay

## 2021-02-03 ENCOUNTER — Other Ambulatory Visit: Payer: Self-pay

## 2021-02-03 ENCOUNTER — Encounter (HOSPITAL_COMMUNITY): Payer: Self-pay | Admitting: Emergency Medicine

## 2021-02-03 DIAGNOSIS — K529 Noninfective gastroenteritis and colitis, unspecified: Secondary | ICD-10-CM

## 2021-02-03 DIAGNOSIS — R112 Nausea with vomiting, unspecified: Secondary | ICD-10-CM | POA: Insufficient documentation

## 2021-02-03 DIAGNOSIS — R1084 Generalized abdominal pain: Secondary | ICD-10-CM | POA: Insufficient documentation

## 2021-02-03 LAB — CBC
HCT: 44.8 % (ref 39.0–52.0)
Hemoglobin: 14.3 g/dL (ref 13.0–17.0)
MCH: 28.4 pg (ref 26.0–34.0)
MCHC: 31.9 g/dL (ref 30.0–36.0)
MCV: 89.1 fL (ref 80.0–100.0)
Platelets: 232 10*3/uL (ref 150–400)
RBC: 5.03 MIL/uL (ref 4.22–5.81)
RDW: 13.2 % (ref 11.5–15.5)
WBC: 7.5 10*3/uL (ref 4.0–10.5)
nRBC: 0 % (ref 0.0–0.2)

## 2021-02-03 LAB — COMPREHENSIVE METABOLIC PANEL
ALT: 24 U/L (ref 0–44)
AST: 23 U/L (ref 15–41)
Albumin: 4.3 g/dL (ref 3.5–5.0)
Alkaline Phosphatase: 58 U/L (ref 38–126)
Anion gap: 9 (ref 5–15)
BUN: 11 mg/dL (ref 6–20)
CO2: 24 mmol/L (ref 22–32)
Calcium: 9 mg/dL (ref 8.9–10.3)
Chloride: 107 mmol/L (ref 98–111)
Creatinine, Ser: 1.13 mg/dL (ref 0.61–1.24)
GFR, Estimated: >60 mL/min (ref >60)
Glucose, Bld: 124 mg/dL — ABNORMAL HIGH (ref 70–99)
Potassium: 3.8 mmol/L (ref 3.5–5.1)
Sodium: 140 mmol/L (ref 135–145)
Total Bilirubin: 0.7 mg/dL (ref 0.3–1.2)
Total Protein: 7.4 g/dL (ref 6.5–8.1)

## 2021-02-03 LAB — LIPASE, BLOOD: Lipase: 26 U/L (ref 11–51)

## 2021-02-03 MED ORDER — HYDROCODONE-ACETAMINOPHEN 5-325 MG PO TABS: 1.0000 | ORAL_TABLET | Freq: Four times a day (QID) | ORAL | 0 refills | Status: DC | PRN

## 2021-02-03 MED ORDER — SODIUM CHLORIDE 0.9 % IV SOLN: INTRAVENOUS | Status: DC

## 2021-02-03 MED ORDER — HYDROMORPHONE HCL 1 MG/ML IJ SOLN
1.0000 mg | Freq: Once | INTRAMUSCULAR | Status: AC
  Administered 2021-02-03: 1 mg via INTRAVENOUS
  Filled 2021-02-03: qty 1

## 2021-02-03 MED ORDER — ONDANSETRON 4 MG PO TBDP: 4.0000 mg | ORAL_TABLET | Freq: Three times a day (TID) | ORAL | 0 refills | Status: AC | PRN

## 2021-02-03 MED ORDER — IOHEXOL 300 MG/ML  SOLN
100.0000 mL | Freq: Once | INTRAMUSCULAR | Status: AC | PRN
  Administered 2021-02-03: 100 mL via INTRAVENOUS

## 2021-02-03 MED ORDER — HYDROCODONE-ACETAMINOPHEN 5-325 MG PO TABS: 1.0000 | ORAL_TABLET | Freq: Four times a day (QID) | ORAL | 0 refills | Status: AC | PRN

## 2021-02-03 MED ORDER — SODIUM CHLORIDE 0.9 % IV BOLUS
1000.0000 mL | Freq: Once | INTRAVENOUS | Status: AC
  Administered 2021-02-03: 1000 mL via INTRAVENOUS

## 2021-02-03 MED ORDER — ONDANSETRON 4 MG PO TBDP: 4.0000 mg | ORAL_TABLET | Freq: Three times a day (TID) | ORAL | 0 refills | Status: DC | PRN

## 2021-02-03 MED ORDER — ONDANSETRON HCL 4 MG/2ML IJ SOLN
4.0000 mg | Freq: Once | INTRAMUSCULAR | Status: AC
  Administered 2021-02-03: 4 mg via INTRAVENOUS
  Filled 2021-02-03: qty 2

## 2021-02-03 NOTE — ED Notes (Signed)
Pt ambulatory to waiting room. Pt verbalized understanding of discharge instructions.   

## 2021-02-03 NOTE — Discharge Instructions (Signed)
CT scan showed some mild colitis.  This should resolve.  Make an appointment follow-up with your regular doctor.  Work note provided to be out of work through Monday.  Take the pain medication as directed.  And take the Zofran as needed for nausea and vomiting.  Return for any new or worse symptoms.  It is possible that this could be first presentation of inflammatory bowel disease as why the follow-up is important.  But this may just be some mild inflammation due to a viral infection that will all resolved.

## 2021-02-03 NOTE — ED Provider Notes (Addendum)
[facility] EMERGENCY DEPARTMENT Provider Note   CSN: [id number] Arrival date & time: 02/03/21  1020     History  Chief Complaint  Patient presents with   Abdominal Pain    Harry Johnson is a 36 y.o. male.  Patient with a complaint of abdominal pain all over for 2 days.  Associated with nausea and 1 episode of vomiting today.  Normal bowel movement this morning.  No urinary symptoms.  Never had pain like this before.  Denies any vomiting of blood.  Past medical history only significant for chronic seasonal allergies upper respiratory in nature.      Home Medications Prior to Admission medications   Medication Sig Start Date End Date Taking? Authorizing Provider  doxycycline (VIBRA-TABS) 100 MG tablet Take 1 tablet (100 mg total) by mouth 2 (two) times daily. 08/23/20   Karie Schwalbe, MD      Allergies    Patient has no known allergies.    Review of Systems   Review of Systems  Constitutional:  Negative for chills and fever.  HENT:  Negative for ear pain and sore throat.   Eyes:  Negative for pain and visual disturbance.  Respiratory:  Negative for cough and shortness of breath.   Cardiovascular:  Negative for chest pain and palpitations.  Gastrointestinal:  Positive for abdominal pain, nausea and vomiting.  Genitourinary:  Negative for dysuria and hematuria.  Musculoskeletal:  Negative for arthralgias and back pain.  Skin:  Negative for color change and rash.  Neurological:  Negative for seizures and syncope.  All other systems reviewed and are negative.  Physical Exam Updated Vital Signs BP 126/81    Pulse 68    Temp 98.4 F (36.9 C) (Oral)    Resp 18    SpO2 100%  Physical Exam Vitals and nursing note reviewed.  Constitutional:      General: He is not in acute distress.    Appearance: Normal appearance. He is well-developed.  HENT:     Head: Normocephalic and atraumatic.  Eyes:     Extraocular Movements: Extraocular movements  intact.     Conjunctiva/sclera: Conjunctivae normal.     Pupils: Pupils are equal, round, and reactive to light.  Cardiovascular:     Rate and Rhythm: Normal rate and regular rhythm.     Heart sounds: No murmur heard. Pulmonary:     Effort: Pulmonary effort is normal. No respiratory distress.     Breath sounds: Normal breath sounds.  Abdominal:     General: There is no distension.     Palpations: Abdomen is soft. There is no mass.     Tenderness: There is no abdominal tenderness. There is no guarding.  Musculoskeletal:        General: No swelling.     Cervical back: Normal range of motion and neck supple.  Skin:    General: Skin is warm and dry.     Capillary Refill: Capillary refill takes less than 2 seconds.  Neurological:     General: No focal deficit present.     Mental Status: He is alert and oriented to person, place, and time.  Psychiatric:        Mood and Affect: Mood normal.    ED Results / Procedures / Treatments   Labs (all labs ordered are listed, but only abnormal results are displayed) Labs Reviewed  COMPREHENSIVE METABOLIC PANEL - Abnormal; Notable for the following components:      Result Value  Glucose, Bld 124 (*)    All other components within normal limits  LIPASE, BLOOD  CBC  URINALYSIS, ROUTINE W REFLEX MICROSCOPIC    EKG EKG Interpretation  Date/Time:  Saturday February 03 2021 10:33:30 EST Ventricular Rate:  69 PR Interval:  214 QRS Duration: 88 QT Interval:  406 QTC Calculation: 435 R Axis:   79 Text Interpretation: Sinus rhythm with sinus arrhythmia with 1st degree A-V block Marked ST abnormality, possible inferior subendocardial injury Abnormal ECG When compared with ECG of 11-Jun-2007 10:46, PREVIOUS ECG IS PRESENT No significant change since last tracing Confirmed by Fredia Sorrow [id number]) on 02/03/2021 11:37:44 AM  Radiology No results found.  Procedures Procedures    Medications Ordered in ED Medications  ondansetron (ZOFRAN)  injection 4 mg (has no administration in time range)  HYDROmorphone (DILAUDID) injection 1 mg (has no administration in time range)  0.9 %  sodium chloride infusion (has no administration in time range)  sodium chloride 0.9 % bolus 1,000 mL (has no administration in time range)    ED Course/ Medical Decision Making/ A&P                           Medical Decision Making Amount and/or Complexity of Data Reviewed Labs: ordered. Radiology: ordered.  Risk Prescription drug management.   Abdominal exam reassuring.  No localized tenderness.  However patient has had persistent abdominal pain now for 2 days.  Will get CT scan to rule out any significant acute process in the abdomen.  Labs lipase normal glucose [id number] complete metabolic panel including liver function test normal.  Kidney function is normal.  With a GFR greater than 60.  CBC no leukocytosis hemoglobin 14.3.  Patient's EKG has a early repolarization changes.  Patient without any chest pain at all.  Not worried about an acute cardiac event.  Will give patient some IV fluids.  Will give some antinausea medicine and pain medication IV.  Patient feeling better after the pain medication and antinausea medicine.  Patient also received some fluids.  CT scan shows some mild inflammation in the sigmoid colon area and rectum area.  Consistent with some mild colitis.  No evidence of any significant complications.  This may be secondary to a viral inflammation.  But always could be the initial presentation inflammatory bowel disease so he will follow-up with his primary care doctor.   Final Clinical Impression(s) / ED Diagnoses Final diagnoses:  Generalized abdominal pain    Rx / DC Orders ED Discharge Orders     None         Fredia Sorrow, MD 02/03/21 1227    Fredia Sorrow, MD 02/03/21 1501

## 2021-02-03 NOTE — ED Triage Notes (Signed)
Reports generalized abd pain x 2 days.  Vomited x 1 this morning.  Normal BM this morning.  Denies urinary symptoms.

## 2021-02-05 ENCOUNTER — Telehealth: Payer: Self-pay

## 2021-02-05 NOTE — Telephone Encounter (Signed)
Spoke to pt about recent ER Visit for colitis. He said he is still having pain. Trying to rest as much as possible. Advised that if he continues to have issues, we can try to get him in with Dr Silvio Pate or other provider.

## 2021-03-13 ENCOUNTER — Other Ambulatory Visit: Payer: Self-pay

## 2021-03-13 ENCOUNTER — Ambulatory Visit: Payer: Self-pay | Admitting: Family Medicine

## 2021-03-13 DIAGNOSIS — N341 Nonspecific urethritis: Secondary | ICD-10-CM

## 2021-03-13 DIAGNOSIS — Z113 Encounter for screening for infections with a predominantly sexual mode of transmission: Secondary | ICD-10-CM

## 2021-03-13 LAB — GRAM STAIN

## 2021-03-13 MED ORDER — DOXYCYCLINE HYCLATE 100 MG PO TABS: 100.0000 mg | ORAL_TABLET | Freq: Two times a day (BID) | ORAL | 0 refills | Status: AC

## 2021-03-13 NOTE — Progress Notes (Signed)
[facility] Department STI clinic/screening visit  Subjective:  Harry Johnson is a 36 y.o. male being seen today for an STI screening visit. The patient reports they do not have symptoms.    Patient has the following medical conditions:   Patient Active Problem List   Diagnosis Date Noted   Exposure to sexually transmitted disease (STD) 08/23/2020   Reactive depression 07/08/2019   Preventative health care 12/08/2018   Constipation 12/08/2018   SOB (shortness of breath) 07/07/2018   Fatigue 05/21/2018   Situational stress 05/21/2018   Chronic seasonal allergic rhinitis due to pollen      Chief Complaint  Patient presents with   SEXUALLY TRANSMITTED DISEASE    screening    HPI  Patient reports he denies symptoms.  Does the patient or their partner desires a pregnancy in the next year? No  Screening for MPX risk: Does the patient have an unexplained rash? No Is the patient MSM? No Does the patient endorse multiple sex partners or anonymous sex partners? No Did the patient have close or sexual contact with a person diagnosed with MPX? No Has the patient traveled outside the [place] where MPX is endemic? No Is there a high clinical suspicion for MPX-- evidenced by one of the following No  -Unlikely to be chickenpox  -Lymphadenopathy  -Rash that present in same phase of evolution on any given body part   See flowsheet for further details and programmatic requirements.    The following portions of the patient's history were reviewed and updated as appropriate: allergies, current medications, past medical history, past social history, past surgical history and problem list.  Objective:  There were no vitals filed for this visit.  Physical Exam Constitutional:      Appearance: Normal appearance.  HENT:     Head: Normocephalic and atraumatic.     Comments: No nits or hair loss    Mouth/Throat:     Mouth: Mucous membranes are moist.     Pharynx: Oropharynx  is clear. No oropharyngeal exudate or posterior oropharyngeal erythema.  Pulmonary:     Effort: Pulmonary effort is normal.  Abdominal:     General: Abdomen is flat.     Palpations: Abdomen is soft. There is no hepatomegaly or mass.     Tenderness: There is no abdominal tenderness.  Genitourinary:    Pubic Area: No rash or pubic lice.      Penis: Normal.      Testes: Normal.     Epididymis:     Right: Normal.     Left: Normal.  Lymphadenopathy:     Head:     Right side of head: No preauricular or posterior auricular adenopathy.     Left side of head: No preauricular or posterior auricular adenopathy.     Cervical: No cervical adenopathy.     Upper Body:     Right upper body: No supraclavicular or axillary adenopathy.     Left upper body: No supraclavicular or axillary adenopathy.     Lower Body: No right inguinal adenopathy. No left inguinal adenopathy.  Skin:    General: Skin is warm and dry.     Findings: No rash.  Neurological:     Mental Status: He is alert and oriented to person, place, and time.      Assessment and Plan:  Harry Johnson is a 36 y.o. male presenting to the [facility] Department for STI screening  1. Screening examination for venereal disease -  Gram stain - Gonococcus culture -Client unable to stay for bloodwork and results, states he starts his first day at work today.  States he will reschedule.  Clinic will notify client if labs are positive. Treat gram stain as per SO   2. Nongonococcal urethritis in male  - doxycycline (VIBRA-TABS) 100 MG tablet; Take 1 tablet (100 mg total) by mouth 2 (two) times daily for 7 days.  Dispense: 14 tablet; Refill: 0  Co. To notify partner for evaluation and treatment. Co. To always use condoms with all sexual activity. No follow-ups on file.  Future Appointments  Date Time Provider Department Center  03/16/2021  9:45 AM Karie Schwalbe, MD [facility]    Larene Pickett, FNP

## 2021-03-13 NOTE — Progress Notes (Signed)
Gram stain reviewed after patient left for work. Patient needs treatment for NGU. TC to patient to inform him of results and need for treatment. Patient states he can come by this afternoon for medication.Burt Knack, RN

## 2021-03-13 NOTE — Progress Notes (Signed)
Patient came back to clinic to pick up his doxycycline. Patient name/DOB/password verified.  Given partner card.Jenetta Downer, RN

## 2021-03-16 ENCOUNTER — Ambulatory Visit: Payer: Self-pay | Admitting: Internal Medicine

## 2021-03-18 LAB — GONOCOCCUS CULTURE

## 2021-07-16 ENCOUNTER — Ambulatory Visit: Payer: Self-pay | Admitting: Nurse Practitioner

## 2021-07-16 DIAGNOSIS — Z113 Encounter for screening for infections with a predominantly sexual mode of transmission: Secondary | ICD-10-CM

## 2021-07-16 NOTE — Progress Notes (Signed)
Patient here for STD testing. Patient declines S/S or exposures. Patient educated when test results will come back.

## 2021-07-16 NOTE — Progress Notes (Signed)
[facility] Department STI clinic/screening visit  Subjective:  Harry Johnson is a 36 y.o. male being seen today for an STI screening visit. The patient reports they  have do notsymptoms.    Patient has the following medical conditions:   Patient Active Problem List   Diagnosis Date Noted   Exposure to sexually transmitted disease (STD) 08/23/2020   Reactive depression 07/08/2019   Preventative health care 12/08/2018   Constipation 12/08/2018   SOB (shortness of breath) 07/07/2018   Fatigue 05/21/2018   Situational stress 05/21/2018   Chronic seasonal allergic rhinitis due to pollen      Chief Complaint  Patient presents with   SEXUALLY TRANSMITTED DISEASE    Screening    HPI  Patient reports to clinic today for STD screening.  Patient is asymptomatic.    Does the patient or their partner desires a pregnancy in the next year? No  Screening for MPX risk: Does the patient have an unexplained rash? No Is the patient MSM? No Does the patient endorse multiple sex partners or anonymous sex partners? No Did the patient have close or sexual contact with a person diagnosed with MPX? No Has the patient traveled outside the [place] where MPX is endemic? No Is there a high clinical suspicion for MPX-- evidenced by one of the following No  -Unlikely to be chickenpox  -Lymphadenopathy  -Rash that present in same phase of evolution on any given body part   See flowsheet for further details and programmatic requirements.   Immunization History  Administered Date(s) Administered   Td 01/15/2015     The following portions of the patient's history were reviewed and updated as appropriate: allergies, current medications, past medical history, past social history, past surgical history and problem list.  Objective:  There were no vitals filed for this visit.  Physical Exam Constitutional:      Appearance: Normal appearance.  HENT:     Head: Normocephalic. No abrasion,  masses or laceration. Hair is normal.     Mouth/Throat:     Mouth: No oral lesions.     Dentition: No dental caries.     Pharynx: No pharyngeal swelling, oropharyngeal exudate, posterior oropharyngeal erythema or uvula swelling.     Tonsils: No tonsillar exudate or tonsillar abscesses.  Eyes:     General: Lids are normal.        Right eye: No discharge.        Left eye: No discharge.     Conjunctiva/sclera: Conjunctivae normal.     Right eye: No exudate.    Left eye: No exudate. Abdominal:     General: Abdomen is flat.     Palpations: Abdomen is soft.     Tenderness: There is no abdominal tenderness. There is no rebound.  Genitourinary:    Pubic Area: No rash or pubic lice.      Penis: Normal and circumcised. No erythema or discharge.      Testes: Normal.        Right: Mass or tenderness not present.        Left: Mass or tenderness not present.     Rectum: Normal.     Comments: Discharge amount: None Color: None   Musculoskeletal:     Cervical back: Full passive range of motion without pain, normal range of motion and neck supple.  Lymphadenopathy:     Cervical: No cervical adenopathy.     Right cervical: No superficial, deep or posterior cervical adenopathy.  Left cervical: No superficial, deep or posterior cervical adenopathy.     Upper Body:     Right upper body: No supraclavicular, axillary or epitrochlear adenopathy.     Left upper body: No supraclavicular, axillary or epitrochlear adenopathy.     Lower Body: No right inguinal adenopathy. No left inguinal adenopathy.  Skin:    General: Skin is warm and dry.     Findings: No lesion or rash.  Neurological:     Mental Status: He is alert and oriented to person, place, and time.  Psychiatric:        Attention and Perception: Attention normal.        Mood and Affect: Mood normal.        Speech: Speech normal.        Behavior: Behavior normal. Behavior is cooperative.       Assessment and Plan:  Harry Johnson  is a 36 y.o. male presenting to the [facility] Department for STI screening  1. Screening examination for venereal disease -36 year old male in clinic for STD screening. -Patient does not have STI symptoms Patient accepted all screenings including urine CT/GC and declines bloodwork for HIV/RPR.  Patient meets criteria for HepB screening? No. Ordered? No - low risk  Patient meets criteria for HepC screening? No. Ordered? No - low risk  Recommended condom use with all sex Discussed importance of condom use for STI prevent   Discussed time line for State Lab results and that patient will be called with positive results and encouraged patient to call if he had not heard in 2 weeks Recommended returning for continued or worsening symptoms.    - Chlamydia/GC NAA, Confirmation     Return if symptoms worsen or fail to improve.    Glenna Fellows, FNP

## 2021-07-19 LAB — CHLAMYDIA/GC NAA, CONFIRMATION
Chlamydia trachomatis, NAA: NEGATIVE
Neisseria gonorrhoeae, NAA: NEGATIVE

## 2021-08-27 ENCOUNTER — Ambulatory Visit: Payer: Self-pay

## 2021-12-21 ENCOUNTER — Encounter: Payer: Self-pay | Admitting: Family Medicine

## 2021-12-21 ENCOUNTER — Ambulatory Visit: Payer: Self-pay | Admitting: Family Medicine

## 2021-12-21 DIAGNOSIS — Z113 Encounter for screening for infections with a predominantly sexual mode of transmission: Secondary | ICD-10-CM

## 2021-12-21 LAB — HEPATITIS B SURFACE ANTIGEN

## 2021-12-21 LAB — HM HIV SCREENING LAB: HM HIV Screening: NEGATIVE

## 2021-12-21 LAB — HM HEPATITIS C SCREENING LAB: HM Hepatitis Screen: NEGATIVE

## 2021-12-21 NOTE — Progress Notes (Signed)
Pt. seen for STI screening by [facility]. Condoms declined, no tx indicated.

## 2021-12-21 NOTE — Progress Notes (Signed)
[facility] Department STI clinic/screening visit  Subjective:  FAHEEM ZIEMANN is a 36 y.o. male being seen today for an STI screening visit. The patient reports they do have symptoms.    Patient has the following medical conditions:   Patient Active Problem List   Diagnosis Date Noted   Exposure to sexually transmitted disease (STD) 08/23/2020   Reactive depression 07/08/2019   Preventative health care 12/08/2018   Constipation 12/08/2018   SOB (shortness of breath) 07/07/2018   Fatigue 05/21/2018   Situational stress 05/21/2018   Chronic seasonal allergic rhinitis due to pollen      Chief Complaint  Patient presents with   SEXUALLY TRANSMITTED DISEASE    Pt. stated their partner told them to get checked; rash    HPI  Patient reports to clinic for STI testing. States he has a "bump" on his penis.  Last HIV test per patient/review of record was No results found for: "HMHIVSCREEN"  Lab Results  Component Value Date   HIV NON-REACTIVE 08/23/2020    Does the patient or their partner desires a pregnancy in the next year? No  Screening for MPX risk: Does the patient have an unexplained rash? No Is the patient MSM? No Does the patient endorse multiple sex partners or anonymous sex partners? No Did the patient have close or sexual contact with a person diagnosed with MPX? No Has the patient traveled outside the [place] where MPX is endemic? No Is there a high clinical suspicion for MPX-- evidenced by one of the following No  -Unlikely to be chickenpox  -Lymphadenopathy  -Rash that present in same phase of evolution on any given body part   See flowsheet for further details and programmatic requirements.   Immunization History  Administered Date(s) Administered   Hepatitis B, PED/ADOLESCENT 10/31/1997, 11/28/1997, 05/01/1998   MMR 02/03/1990   Td 01/15/2015     The following portions of the patient's history were reviewed and updated as appropriate:  allergies, current medications, past medical history, past social history, past surgical history and problem list.  Objective:  There were no vitals filed for this visit.  Physical Exam Constitutional:      Appearance: Normal appearance.  HENT:     Head: Normocephalic and atraumatic.     Comments: No nits or hair loss    Mouth/Throat:     Mouth: Mucous membranes are moist. No oral lesions.     Pharynx: Oropharynx is clear. No oropharyngeal exudate or posterior oropharyngeal erythema.  Eyes:     General:        Right eye: No discharge.        Left eye: No discharge.     Conjunctiva/sclera:     Right eye: Right conjunctiva is not injected. No exudate.    Left eye: Left conjunctiva is not injected. No exudate. Pulmonary:     Effort: Pulmonary effort is normal.  Abdominal:     General: Abdomen is flat.     Palpations: Abdomen is soft. There is no hepatomegaly or mass.     Tenderness: There is no abdominal tenderness. There is no rebound.     Hernia: There is no hernia in the left inguinal area or right inguinal area.  Genitourinary:    Pubic Area: No rash or pubic lice (no nits).      Penis: Normal and circumcised. No tenderness, discharge, swelling or lesions.      Testes: Normal.     Epididymis:     Right: Normal.  No mass or tenderness.     Left: Normal. No mass or tenderness.     Tanner stage (genital): 5.     Rectum: Normal. No tenderness (no lesions or discharge).       Comments: Penile Discharge- none  1 small lesion on head of penis- flat, erythematous base Lymphadenopathy:     Head:     Right side of head: No preauricular or posterior auricular adenopathy.     Left side of head: No preauricular or posterior auricular adenopathy.     Cervical: No cervical adenopathy.     Upper Body:     Right upper body: No supraclavicular, axillary or epitrochlear adenopathy.     Left upper body: No supraclavicular, axillary or epitrochlear adenopathy.     Lower Body: No right  inguinal adenopathy. No left inguinal adenopathy.  Skin:    General: Skin is warm and dry.     Findings: No lesion or rash.  Neurological:     Mental Status: He is alert and oriented to person, place, and time.       Assessment and Plan:  URBAN NAVAL is a 36 y.o. male presenting to the [facility] Department for STI screening  1. Screening for venereal disease  -Hep B and C - Chlamydia/GC NAA, Confirmation - HIV [place] [facility] - Syphilis Serology, [place] [facility]   Patient does have STI symptoms Patient accepted all screenings including   Patient meets criteria for HepB screening? Yes. Ordered? yes Patient meets criteria for HepC screening? Yes. Ordered? yes Recommended condom use with all sex Discussed importance of condom use for STI prevent  Treat per standing order Discussed time line for State Lab results and that patient will be called with positive results and encouraged patient to call if he had not heard in 2 weeks Recommended returning for continued or worsening symptoms.   Return if symptoms worsen or fail to improve.  Total time spent 20 minutes.   Sharlet Salina, [place]

## 2021-12-28 ENCOUNTER — Telehealth: Payer: Self-pay

## 2021-12-28 LAB — CHLAMYDIA/GC NAA, CONFIRMATION
Chlamydia trachomatis, NAA: POSITIVE — AB
Neisseria gonorrhoeae, NAA: NEGATIVE

## 2021-12-28 LAB — C. TRACHOMATIS NAA, CONFIRM: C. trachomatis NAA, Confirm: POSITIVE — AB

## 2021-12-28 NOTE — Telephone Encounter (Signed)
Calling pt re positive chlamydia result from 12/21/21 urine specimen. Pt needs tx appt.  Phone call to [phone]. Pt confirmed password. Counseled pt re + CT result. Pt states NKA.  Tx appt scheduled for 12/31/21 (booked in open appt on 2 RN schedule).

## 2021-12-31 ENCOUNTER — Ambulatory Visit: Payer: Self-pay

## 2021-12-31 DIAGNOSIS — A749 Chlamydial infection, unspecified: Secondary | ICD-10-CM

## 2021-12-31 MED ORDER — DOXYCYCLINE HYCLATE 100 MG PO TABS: 100.0000 mg | ORAL_TABLET | Freq: Two times a day (BID) | ORAL | 0 refills | Status: AC

## 2021-12-31 NOTE — Progress Notes (Signed)
In nurse clinic for chlamydia treatment. Treated today per SO Dr Karyl Kinnier with doxycycline.  The patient was dispensed doxycycline 100 mg #14 today. Instructions reviewe on how to take med. I provided counseling today regarding the medication. We discussed the medication, the side effects and when to call clinic. Patient given the opportunity to ask questions. Questions answered. Voices understanding. Jerel Shepherd, RN

## 2022-03-28 ENCOUNTER — Ambulatory Visit: Payer: Self-pay | Admitting: Family Medicine

## 2022-03-28 ENCOUNTER — Encounter: Payer: Self-pay | Admitting: Family Medicine

## 2022-03-28 DIAGNOSIS — Z113 Encounter for screening for infections with a predominantly sexual mode of transmission: Secondary | ICD-10-CM

## 2022-03-28 LAB — GRAM STAIN

## 2022-03-28 NOTE — Progress Notes (Signed)
[facility] Department STI clinic/screening visit  Subjective:  WALLER MONTE is a 37 y.o. male being seen today for an STI screening visit. The patient reports they do not have symptoms.    Patient has the following medical conditions:   Patient Active Problem List   Diagnosis Date Noted   Reactive depression 07/08/2019   Constipation 12/08/2018   SOB (shortness of breath) 07/07/2018   Fatigue 05/21/2018   Situational stress 05/21/2018   Chronic seasonal allergic rhinitis due to pollen      Chief Complaint  Patient presents with   SEXUALLY TRANSMITTED DISEASE    Screening.    HPI Patient reports to clinic for STI testing. Patient told the RN that his partner "cheated on him again so it has to be chlamydia again". Patient last had sex with her yesterday. Pt asked NP to "give me some medication just in case?" I reviewed that without a contact card, we don't know for sure what his partner had and different STIs require different treatments.   Last HIV test per patient/review of record was  Lab Results  Component Value Date   HMHIVSCREEN Negative - Validated 12/21/2021    Lab Results  Component Value Date   HIV NON-REACTIVE 08/23/2020    Does the patient or their partner desires a pregnancy in the next year? No  Screening for MPX risk: Does the patient have an unexplained rash? No Is the patient MSM? No Does the patient endorse multiple sex partners or anonymous sex partners? No Did the patient have close or sexual contact with a person diagnosed with MPX? No Has the patient traveled outside the [place] where MPX is endemic? No Is there a high clinical suspicion for MPX-- evidenced by one of the following No  -Unlikely to be chickenpox  -Lymphadenopathy  -Rash that present in same phase of evolution on any given body part   See flowsheet for further details and programmatic requirements.   Immunization History  Administered Date(s) Administered    Hepatitis B, PED/ADOLESCENT 10/31/1997, 11/28/1997, 05/01/1998   MMR 02/03/1990   Td 01/15/2015     The following portions of the patient's history were reviewed and updated as appropriate: allergies, current medications, past medical history, past social history, past surgical history and problem list.  Objective:  There were no vitals filed for this visit.  Physical Exam Constitutional:      Appearance: Normal appearance.  HENT:     Head: Normocephalic and atraumatic.     Comments: No nits or hair loss    Mouth/Throat:     Mouth: Mucous membranes are moist. No oral lesions.     Pharynx: Oropharynx is clear. No oropharyngeal exudate or posterior oropharyngeal erythema.  Eyes:     General:        Right eye: No discharge.        Left eye: No discharge.     Conjunctiva/sclera:     Right eye: Right conjunctiva is not injected. No exudate.    Left eye: Left conjunctiva is not injected. No exudate. Pulmonary:     Effort: Pulmonary effort is normal.  Abdominal:     General: Abdomen is flat.     Palpations: Abdomen is soft. There is no hepatomegaly or mass.     Tenderness: There is no abdominal tenderness. There is no rebound.     Hernia: There is no hernia in the left inguinal area or right inguinal area.  Genitourinary:    Pubic Area: No rash  or pubic lice (no nits).      Penis: Normal. No tenderness, discharge, swelling or lesions.      Testes: Normal.     Epididymis:     Right: Normal. No mass or tenderness.     Left: Normal. No mass or tenderness.     Rectum: Normal. No tenderness (no lesions or discharge).     Comments: Penile Discharge Amount: none Color:  none Lymphadenopathy:     Head:     Right side of head: No preauricular or posterior auricular adenopathy.     Left side of head: No preauricular or posterior auricular adenopathy.     Cervical: No cervical adenopathy.     Upper Body:     Right upper body: No supraclavicular, axillary or epitrochlear adenopathy.      Left upper body: No supraclavicular, axillary or epitrochlear adenopathy.     Lower Body: No right inguinal adenopathy. No left inguinal adenopathy.  Skin:    General: Skin is warm and dry.     Findings: No lesion or rash.  Neurological:     Mental Status: He is alert and oriented to person, place, and time.    Assessment and Plan:  TASHUN ROULHAC is a 37 y.o. male presenting to the [facility] Department for STI screening  1. Screening for venereal disease  - Chlamydia/GC NAA, Confirmation - Gram stain - Gonococcus culture  Patient does not have STI symptoms Patient accepted all screenings including  urine GC/Chlamydia, and blood work for HIV/Syphilis. Patient meets criteria for HepB screening? No. Ordered? not applicable Patient meets criteria for HepC screening? No. Ordered? not applicable Recommended condom use with all sex Discussed importance of condom use for STI prevent  Treat positive test results per standing order. Discussed time line for State Lab results and that patient will be called with positive results and encouraged patient to call if he had not heard in 2 weeks Recommended repeat testing in 3 months with positive results. Recommended returning for continued or worsening symptoms.   Return if symptoms worsen or fail to improve.  Total time spent 20 minutes   Sharlet Salina, [place]

## 2022-03-28 NOTE — Progress Notes (Signed)
Pt is here for STD screening.  Gram stain results reviewed, no treatment required.  Condoms declined.  Jaidon Ellery M Tyshika Baldridge, RN  

## 2022-04-02 LAB — CHLAMYDIA/GC NAA, CONFIRMATION
Chlamydia trachomatis, NAA: NEGATIVE
Neisseria gonorrhoeae, NAA: NEGATIVE

## 2022-04-02 LAB — GONOCOCCUS CULTURE

## 2022-04-29 NOTE — Addendum Note (Signed)
Addended by: Heywood Bene on: 04/29/2022 01:35 PM   Modules accepted: Orders

## 2022-07-22 ENCOUNTER — Encounter: Payer: Self-pay | Admitting: Family Medicine

## 2022-07-22 ENCOUNTER — Ambulatory Visit: Payer: Self-pay | Admitting: Family Medicine

## 2022-07-22 DIAGNOSIS — Z113 Encounter for screening for infections with a predominantly sexual mode of transmission: Secondary | ICD-10-CM

## 2022-07-22 NOTE — Progress Notes (Signed)
PT here for STD screening, condoms declined.

## 2022-07-22 NOTE — Progress Notes (Signed)
[facility] Department STI clinic/screening visit  Subjective:  Harry Johnson is a 37 y.o. male being seen today for an STI screening visit. The patient reports they do not have symptoms.    Patient has the following medical conditions:   Patient Active Problem List   Diagnosis Date Noted   Reactive depression 07/08/2019   Constipation 12/08/2018   SOB (shortness of breath) 07/07/2018   Fatigue 05/21/2018   Situational stress 05/21/2018   Chronic seasonal allergic rhinitis due to pollen      Chief Complaint  Patient presents with   SEXUALLY TRANSMITTED DISEASE    No symptoms    HPI  Patient reports to clinic for STI testing- asymptomatic.   Last HIV test per patient/review of record was  Lab Results  Component Value Date   HMHIVSCREEN Negative - Validated 12/21/2021    Lab Results  Component Value Date   HIV NON-REACTIVE 08/23/2020    Does the patient or their partner desires a pregnancy in the next year? No  Screening for MPX risk: Does the patient have an unexplained rash? No Is the patient MSM? No Does the patient endorse multiple sex partners or anonymous sex partners? No Did the patient have close or sexual contact with a person diagnosed with MPX? No Has the patient traveled outside the [place] where MPX is endemic? No Is there a high clinical suspicion for MPX-- evidenced by one of the following No  -Unlikely to be chickenpox  -Lymphadenopathy  -Rash that present in same phase of evolution on any given body part   See flowsheet for further details and programmatic requirements.   Immunization History  Administered Date(s) Administered   Hepatitis B, PED/ADOLESCENT 10/31/1997, 11/28/1997, 05/01/1998   MMR 02/03/1990   Td 01/15/2015     The following portions of the patient's history were reviewed and updated as appropriate: allergies, current medications, past medical history, past social history, past surgical history and problem  list.  Objective:  There were no vitals filed for this visit.  Physical Exam Declines physical exam   Assessment and Plan:  Harry Johnson is a 37 y.o. male presenting to the [facility] Department for STI screening  1. Screening for venereal disease Declines blood work today  - Chlamydia/GC NAA, Confirmation   Patient does not have STI symptoms Patient accepted all screenings including  urine GC/Chlamydia, and blood work for HIV/Syphilis. Patient meets criteria for HepB screening? No. Ordered? not applicable Patient meets criteria for HepC screening? No. Ordered? not applicable Recommended condom use with all sex Discussed importance of condom use for STI prevent  Treat positive test results per standing order. Discussed time line for State Lab results and that patient will be called with positive results and encouraged patient to call if he had not heard in 2 weeks Recommended repeat testing in 3 months with positive results. Recommended returning for continued or worsening symptoms.   Return if symptoms worsen or fail to improve, for STI screening.  No future appointments. Total time spent 10 minutes  Lenice Llamas, [place]

## 2022-07-26 LAB — C. TRACHOMATIS NAA, CONFIRM: C. trachomatis NAA, Confirm: POSITIVE — AB

## 2022-07-26 LAB — CHLAMYDIA/GC NAA, CONFIRMATION
Chlamydia trachomatis, NAA: POSITIVE — AB
Neisseria gonorrhoeae, NAA: NEGATIVE

## 2022-07-30 ENCOUNTER — Ambulatory Visit: Payer: Self-pay

## 2022-07-30 ENCOUNTER — Telehealth: Payer: Self-pay

## 2022-07-30 DIAGNOSIS — A749 Chlamydial infection, unspecified: Secondary | ICD-10-CM

## 2022-07-30 MED ORDER — DOXYCYCLINE HYCLATE 100 MG PO TABS
100.0000 mg | ORAL_TABLET | Freq: Two times a day (BID) | ORAL | Status: AC
Start: 2022-07-30 — End: 2022-08-06

## 2022-07-30 NOTE — Telephone Encounter (Signed)
Pt notified of positive Chlamydia results.  Treatment appointment made for 07/30/2022 @ 10:30

## 2022-07-30 NOTE — Progress Notes (Addendum)
Pt here for chlamydia treatment. The patient was dispensed doxycycline today. I provided counseling today regarding the medication. We discussed the medication, the side effects and when to call clinic. Patient given the opportunity to ask questions. Questions answered.  Contact card and condoms given. Gaspar Garbe, RN

## 2022-09-08 IMAGING — CT CT ABD-PELV W/ CM
2 of 4 series · 16 of 46 positions shown, 18 images · IV contrast (APPLIED)
Comparison: None.

CLINICAL DATA: Nausea, vomiting and abdominal pain.

EXAM:
CT ABDOMEN AND PELVIS WITH CONTRAST
TECHNIQUE: Multidetector CT imaging of the abdomen and pelvis was performed
using the standard protocol following bolus administration of
intravenous contrast.

[Series 3: abd/ pelvis 5.0 i30f 2 · axial · 0.76mm/px · z∈[+767,+1207]mm · 13 of 98 slices shown, 15 images]
[im 5/98  soft-tissue]
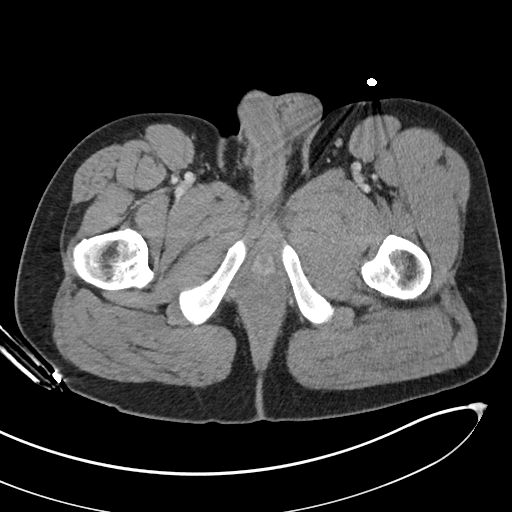
[im 5/98  bone]
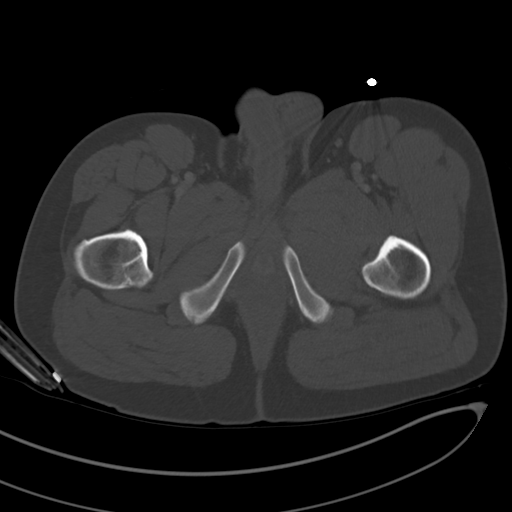
[im 13/98  soft-tissue]
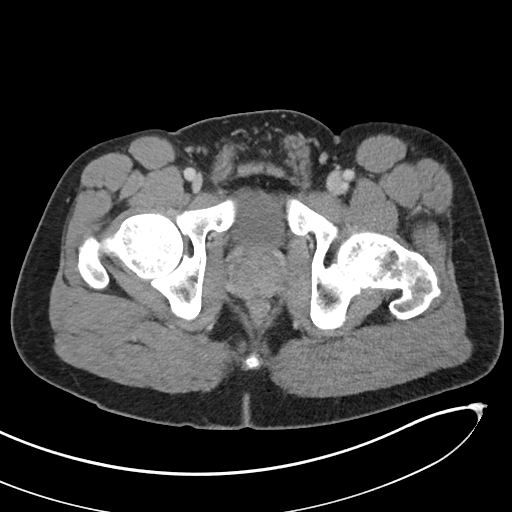
[im 21/98  soft-tissue]
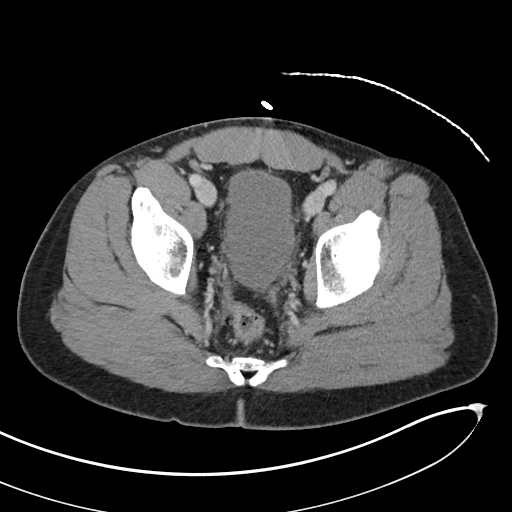
[im 29/98  soft-tissue]
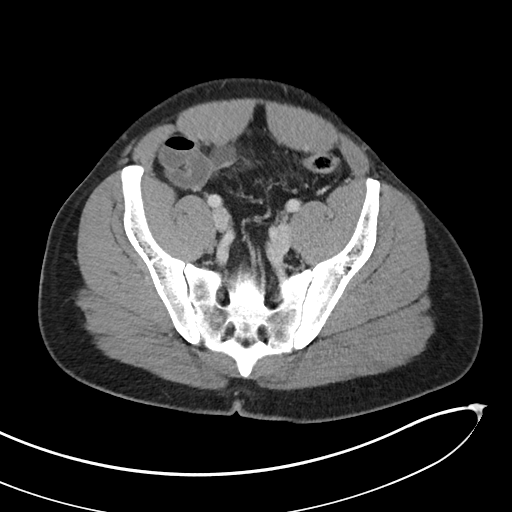
[im 33/98  soft-tissue]
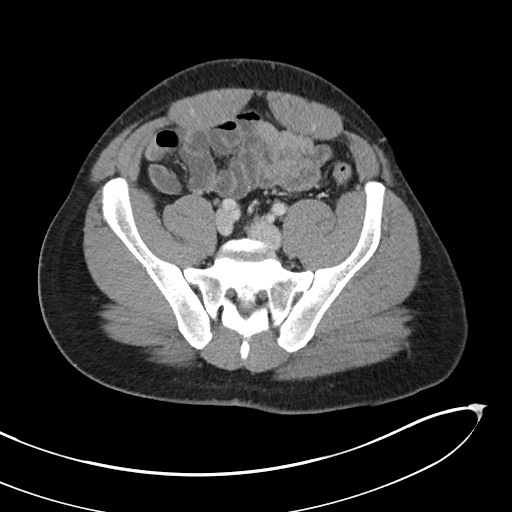
[im 41/98  soft-tissue]
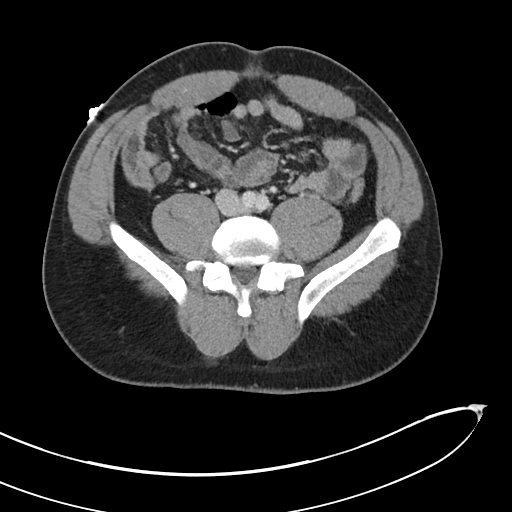
[im 49/98  soft-tissue]
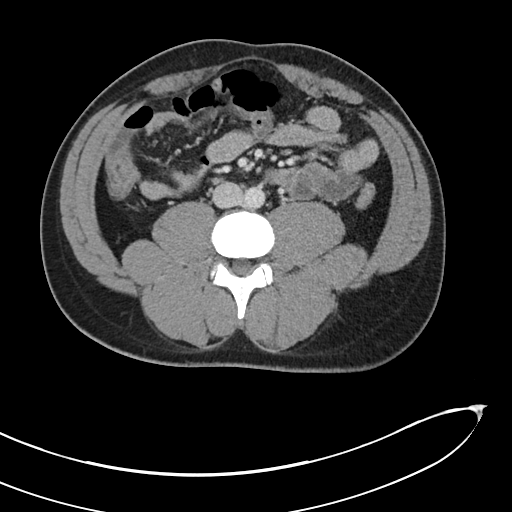
[im 57/98  soft-tissue]
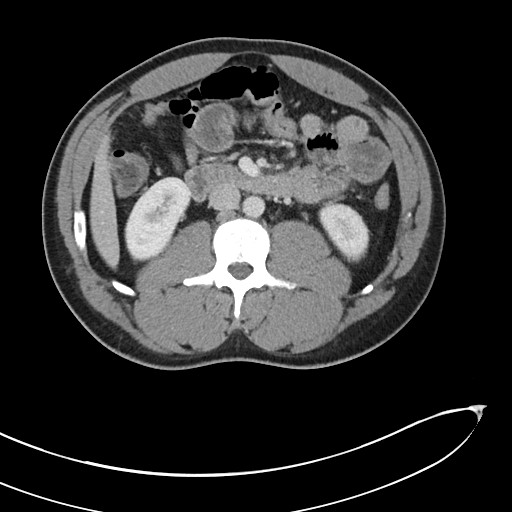
[im 65/98  soft-tissue]
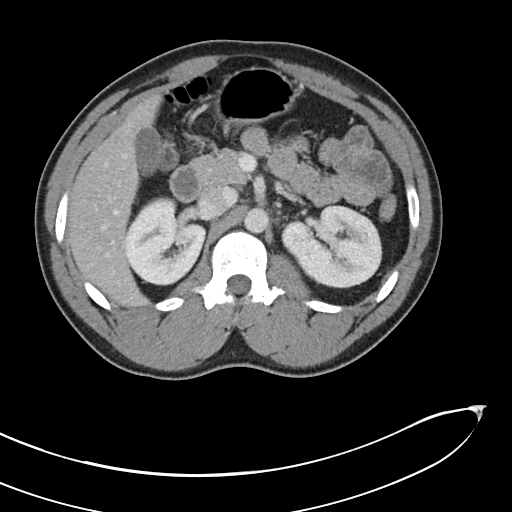
[im 65/98  bone]
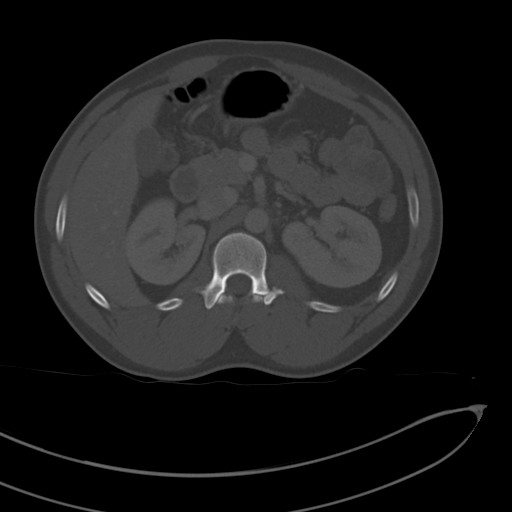
[im 69/98  soft-tissue]
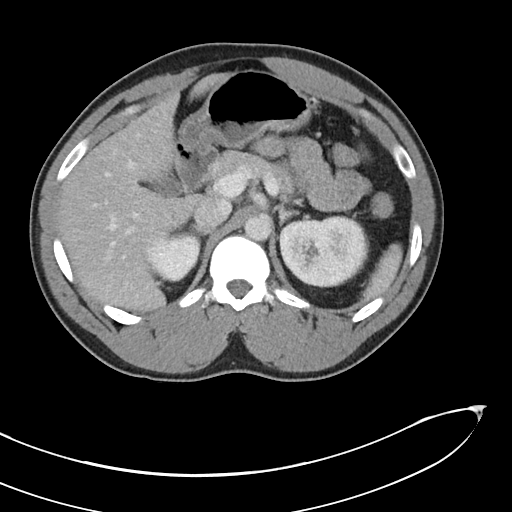
[im 77/98  soft-tissue]
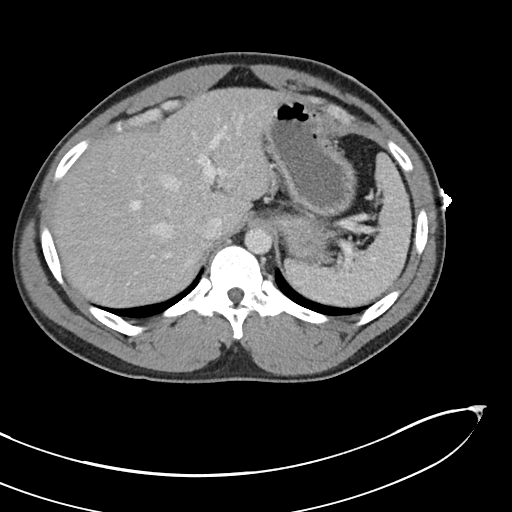
[im 85/98  soft-tissue]
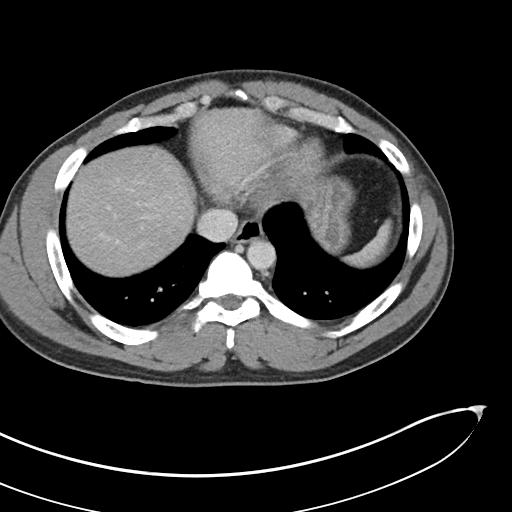
[im 93/98  soft-tissue]
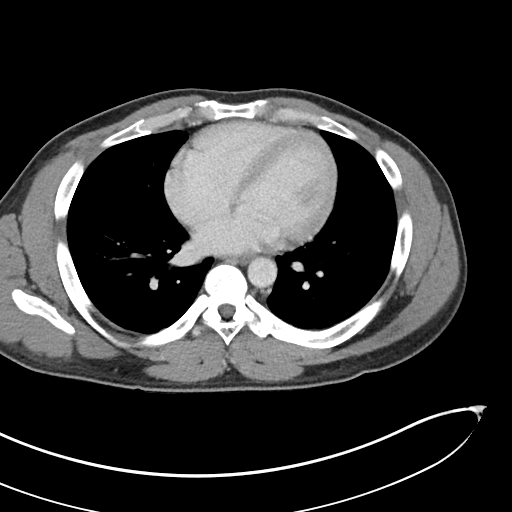

[Series 6: coronal soft tissue · coronal · 0.73mm/px · 3 of 98 slices shown]
[im 33/98  soft-tissue]
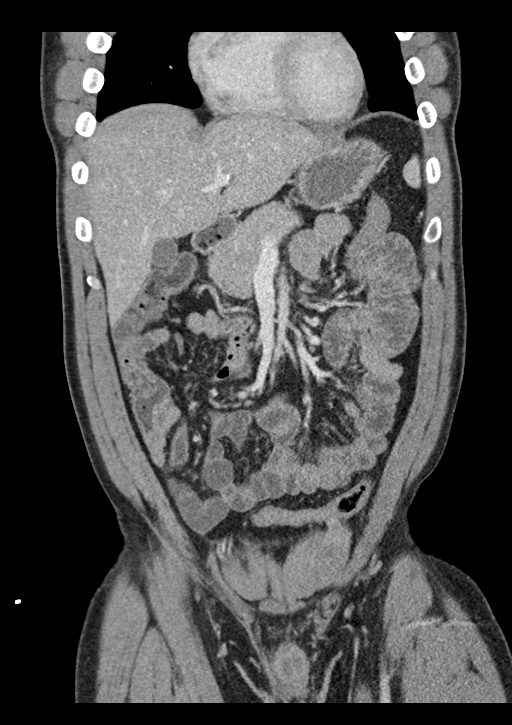
[im 44/98  soft-tissue]
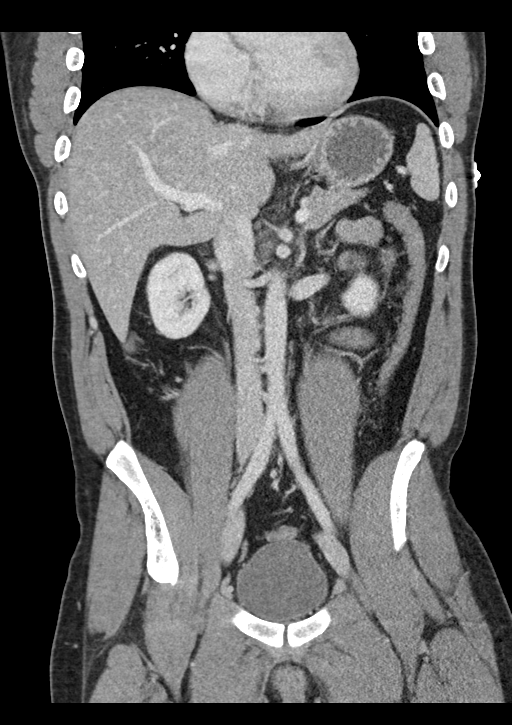
[im 54/98  soft-tissue]
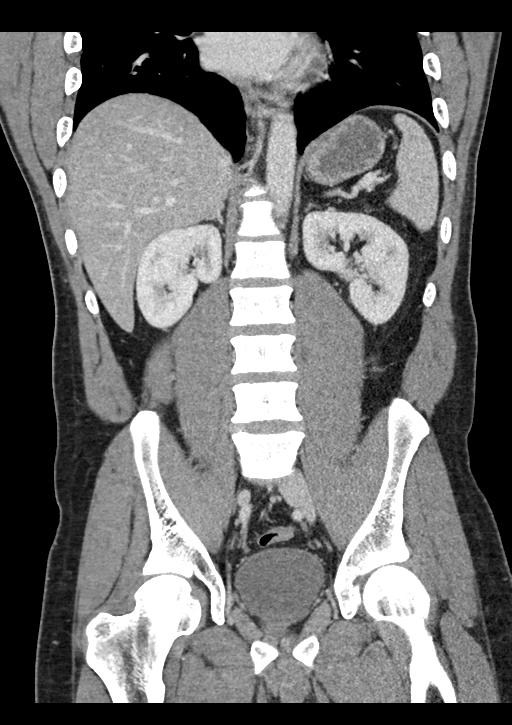

[16 of 46 positions shown; findings below may reference images not displayed]

RADIATION DOSE REDUCTION: This exam was performed according to the
departmental dose-optimization program which includes automated
exposure control, adjustment of the mA and/or kV according to
patient size and/or use of iterative reconstruction technique.

CONTRAST:  100mL OMNIPAQUE IOHEXOL 300 MG/ML  SOLN
FINDINGS: Lower chest: Normal heart size. Dependent atelectasis within the
bilateral lower lobes.

Hepatobiliary: Liver is normal in size and contour. No focal lesions
identified. Gallbladder is unremarkable.

Pancreas: Unremarkable

Spleen: Unremarkable

Adrenals/Urinary Tract: Normal adrenal glands. Kidneys enhance
symmetrically with contrast. No hydronephrosis. Urinary bladder is
unremarkable.

Stomach/Bowel: Normal morphology of the stomach. No evidence for
small bowel obstruction. There is wall thickening of the sigmoid
colon and rectum with small amount of surrounding fat stranding.

Vascular/Lymphatic: Normal caliber abdominal aorta. No
retroperitoneal lymphadenopathy.

Reproductive: Heterogeneous prostate.

Other: None.

Musculoskeletal: No aggressive or acute appearing osseous lesions.
IMPRESSION: Mild wall thickening of the sigmoid colon and rectum raising the
possibility of mild colitis.

## 2022-10-03 ENCOUNTER — Ambulatory Visit: Payer: Self-pay

## 2022-10-03 ENCOUNTER — Ambulatory Visit: Payer: Self-pay | Admitting: Family Medicine

## 2022-10-03 DIAGNOSIS — Z113 Encounter for screening for infections with a predominantly sexual mode of transmission: Secondary | ICD-10-CM

## 2022-10-03 NOTE — Progress Notes (Signed)
[facility] Department STI clinic/screening visit  Subjective:  Harry Johnson is a 37 y.o. male being seen today for an STI screening visit. The patient reports they do not have symptoms.    Patient has the following medical conditions:   Patient Active Problem List   Diagnosis Date Noted   Reactive depression 07/08/2019   Constipation 12/08/2018   SOB (shortness of breath) 07/07/2018   Fatigue 05/21/2018   Situational stress 05/21/2018   Chronic seasonal allergic rhinitis due to pollen      Chief Complaint  Patient presents with   SEXUALLY TRANSMITTED DISEASE    No symptoms    HPI  Patient reports to clinic for repeat testing after having chlamydia about 3 months ago  Last HIV test per patient/review of record was  Lab Results  Component Value Date   HMHIVSCREEN Negative - Validated 12/21/2021    Lab Results  Component Value Date   HIV NON-REACTIVE 08/23/2020    Does the patient or their partner desires a pregnancy in the next year? No  Screening for MPX risk: Does the patient have an unexplained rash? No Is the patient MSM? No Does the patient endorse multiple sex partners or anonymous sex partners? No Did the patient have close or sexual contact with a person diagnosed with MPX? No Has the patient traveled outside the [place] where MPX is endemic? No Is there a high clinical suspicion for MPX-- evidenced by one of the following No  -Unlikely to be chickenpox  -Lymphadenopathy  -Rash that present in same phase of evolution on any given body part   See flowsheet for further details and programmatic requirements.   Immunization History  Administered Date(s) Administered   Hepatitis B, PED/ADOLESCENT 10/31/1997, 11/28/1997, 05/01/1998   MMR 02/03/1990   Td 01/15/2015     The following portions of the patient's history were reviewed and updated as appropriate: allergies, current medications, past medical history, past social history, past surgical  history and problem list.  Objective:  There were no vitals filed for this visit.  Physical Exam Vitals and nursing note reviewed.  Constitutional:      Appearance: Normal appearance.  HENT:     Head: Normocephalic and atraumatic.     Mouth/Throat:     Mouth: Mucous membranes are moist.     Pharynx: No oropharyngeal exudate or posterior oropharyngeal erythema.  Eyes:     General:        Right eye: No discharge.        Left eye: No discharge.     Conjunctiva/sclera:     Right eye: Right conjunctiva is not injected. No exudate.    Left eye: Left conjunctiva is not injected. No exudate. Pulmonary:     Effort: Pulmonary effort is normal.  Abdominal:     General: Abdomen is flat.     Palpations: Abdomen is soft. There is no hepatomegaly or mass.     Tenderness: There is no abdominal tenderness. There is no rebound.  Genitourinary:    Comments: Declined genital exam- asymptomatic Lymphadenopathy:     Cervical: No cervical adenopathy.     Upper Body:     Right upper body: No supraclavicular or axillary adenopathy.     Left upper body: No supraclavicular or axillary adenopathy.  Skin:    General: Skin is warm and dry.  Neurological:     Mental Status: He is alert and oriented to person, place, and time.       Assessment and  Plan:  Harry Johnson is a 37 y.o. male presenting to the [facility] Department for STI screening  1. Screening for venereal disease  - Chlamydia/GC NAA, Confirmation   Patient does not have STI symptoms Patient accepted all screenings including  urine GC/Chlamydia, and blood work for HIV/Syphilis. Patient meets criteria for HepB screening? No. Ordered? not applicable Patient meets criteria for HepC screening? No. Ordered? not applicable Recommended condom use with all sex Discussed importance of condom use for STI prevent  Treat positive test results per standing order. Discussed time line for State Lab results and that patient  will be called with positive results and encouraged patient to call if he had not heard in 2 weeks Recommended repeat testing in 3 months with positive results. Recommended returning for continued or worsening symptoms.   Return if symptoms worsen or fail to improve, for STI screening.  Future Appointments  Date Time Provider Department Center  10/03/2022 10:00 AM Lenice Llamas, FNP [facility] None  Total time spent 20 minutes   Lenice Llamas, [place]

## 2022-10-07 LAB — CHLAMYDIA/GC NAA, CONFIRMATION
Chlamydia trachomatis, NAA: NEGATIVE
Neisseria gonorrhoeae, NAA: NEGATIVE

## 2022-11-28 ENCOUNTER — Encounter: Payer: Self-pay | Admitting: Family Medicine

## 2022-11-28 ENCOUNTER — Other Ambulatory Visit: Payer: Self-pay | Admitting: Internal Medicine

## 2022-11-28 ENCOUNTER — Ambulatory Visit: Payer: Self-pay | Admitting: Family Medicine

## 2022-11-28 DIAGNOSIS — Z202 Contact with and (suspected) exposure to infections with a predominantly sexual mode of transmission: Secondary | ICD-10-CM

## 2022-11-28 DIAGNOSIS — Z113 Encounter for screening for infections with a predominantly sexual mode of transmission: Secondary | ICD-10-CM

## 2022-11-28 MED ORDER — DOXYCYCLINE HYCLATE 100 MG PO TABS: 100.0000 mg | ORAL_TABLET | Freq: Two times a day (BID) | ORAL | Status: AC

## 2022-11-28 MED ORDER — DOXYCYCLINE HYCLATE 100 MG PO TABS
100.0000 mg | ORAL_TABLET | Freq: Two times a day (BID) | ORAL | Status: AC
Start: 2022-11-28 — End: 2022-12-05

## 2022-11-28 NOTE — Progress Notes (Signed)
Pt is here for STD screening, contact to chlamydia. Condoms given. Kwadwo Hertha Gergen RN.

## 2022-11-28 NOTE — Progress Notes (Signed)
The patient was dispensed doxycycline today. I provided counseling today regarding the medication. We discussed the medication, the side effects and when to call clinic. Patient given the opportunity to ask questions. Questions answered.  Gaspar Garbe, RN

## 2022-11-28 NOTE — Progress Notes (Signed)
[facility] Department STI clinic/screening visit  Subjective:  Harry Johnson is a 37 y.o. male being seen today for an STI screening visit. The patient reports they do not have symptoms.    Patient has the following medical conditions:   Patient Active Problem List   Diagnosis Date Noted   Reactive depression 07/08/2019   Constipation 12/08/2018   SOB (shortness of breath) 07/07/2018   Fatigue 05/21/2018   Situational stress 05/21/2018   Chronic seasonal allergic rhinitis due to pollen      Chief Complaint  Patient presents with   Exposure to STD    Chlamydia    Exposure to STD    Patient reports to clinic as a contact to chlamydia. He reports that 2 months ago he had sex with 1 partner and she recently told him she has chlamydia. Since then he has had 1 more partner- this partner refuses to come in for testing- and desires EPT.   Last HIV test per patient/review of record was  Lab Results  Component Value Date   HMHIVSCREEN Negative - Validated 12/21/2021    Lab Results  Component Value Date   HIV NON-REACTIVE 08/23/2020    Last HEPC test per patient/review of record was  Lab Results  Component Value Date   HMHEPCSCREEN Negative-Validated 12/21/2021   No components found for: "HEPC"   Last HEPB test per patient/review of record was No components found for: "HMHEPBSCREEN" No components found for: "HEPC"   Does the patient or their partner desires a pregnancy in the next year? No  Screening for MPX risk: Does the patient have an unexplained rash? No Is the patient MSM? No Does the patient endorse multiple sex partners or anonymous sex partners? No Did the patient have close or sexual contact with a person diagnosed with MPX? No Has the patient traveled outside the [place] where MPX is endemic? No Is there a high clinical suspicion for MPX-- evidenced by one of the following No  -Unlikely to be chickenpox  -Lymphadenopathy  -Rash that present in  same phase of evolution on any given body part   See flowsheet for further details and programmatic requirements.   Immunization History  Administered Date(s) Administered   Hepatitis B, PED/ADOLESCENT 10/31/1997, 11/28/1997, 05/01/1998   MMR 02/03/1990   Td 01/15/2015     The following portions of the patient's history were reviewed and updated as appropriate: allergies, current medications, past medical history, past social history, past surgical history and problem list.  Objective:  There were no vitals filed for this visit.  Physical Exam Vitals and nursing note reviewed.  Constitutional:      Appearance: Normal appearance.  HENT:     Head: Normocephalic and atraumatic.     Mouth/Throat:     Mouth: Mucous membranes are moist.     Pharynx: No oropharyngeal exudate or posterior oropharyngeal erythema.  Eyes:     General:        Right eye: No discharge.        Left eye: No discharge.     Conjunctiva/sclera:     Right eye: Right conjunctiva is not injected. No exudate.    Left eye: Left conjunctiva is not injected. No exudate. Pulmonary:     Effort: Pulmonary effort is normal.  Abdominal:     General: Abdomen is flat.     Palpations: Abdomen is soft. There is no hepatomegaly or mass.     Tenderness: There is no abdominal tenderness. There is no  rebound.  Genitourinary:    Comments: Declined genital exam- asymptomatic Lymphadenopathy:     Cervical: No cervical adenopathy.     Upper Body:     Right upper body: No supraclavicular or axillary adenopathy.     Left upper body: No supraclavicular or axillary adenopathy.  Skin:    General: Skin is warm and dry.  Neurological:     Mental Status: He is alert and oriented to person, place, and time.       Assessment and Plan:  Harry Johnson is a 37 y.o. male presenting to the [facility] Department for STI screening  1. Screening for venereal disease  - Chlamydia/GC NAA, Confirmation  2. Chlamydia  contact -EPT given today- patient declines to give partners name, as "she does not want that information give, she works in the health care field" -he reports she has had her tubes tied and "cannot get pregnant" also reports that she does not have allergies  - doxycycline (VIBRA-TABS) 100 MG tablet; Take 1 tablet (100 mg total) by mouth 2 (two) times daily for 7 days. - doxycycline (VIBRA-TABS) 100 MG tablet; Take 1 tablet (100 mg total) by mouth 2 (two) times daily for 7 days.   Patient does not have STI symptoms Patient accepted all screenings including  urine GC/Chlamydia. Declines blood work today Patient meets criteria for HepB screening? No. Ordered? not applicable Patient meets criteria for HepC screening? No. Ordered? not applicable Recommended condom use with all sex Discussed importance of condom use for STI prevent  Treat positive test results per standing order. Discussed time line for State Lab results and that patient will be called with positive results and encouraged patient to call if he had not heard in 2 weeks Recommended repeat testing in 3 months with positive results. Recommended returning for continued or worsening symptoms.   Return if symptoms worsen or fail to improve, for STI screening.  No future appointments.  Lenice Llamas, [place]

## 2022-12-01 LAB — CHLAMYDIA/GC NAA, CONFIRMATION
Chlamydia trachomatis, NAA: NEGATIVE
Neisseria gonorrhoeae, NAA: NEGATIVE

## 2023-01-21 ENCOUNTER — Ambulatory Visit: Payer: Self-pay | Admitting: Nurse Practitioner

## 2023-01-21 ENCOUNTER — Encounter: Payer: Self-pay | Admitting: Nurse Practitioner

## 2023-01-21 DIAGNOSIS — A749 Chlamydial infection, unspecified: Secondary | ICD-10-CM

## 2023-01-21 DIAGNOSIS — Z113 Encounter for screening for infections with a predominantly sexual mode of transmission: Secondary | ICD-10-CM

## 2023-01-21 DIAGNOSIS — Z91199 Patient's noncompliance with other medical treatment and regimen due to unspecified reason: Secondary | ICD-10-CM

## 2023-01-21 MED ORDER — AZITHROMYCIN 500 MG PO TABS
1000.0000 mg | ORAL_TABLET | Freq: Once | ORAL | Status: AC
  Administered 2023-01-21: 1000 mg via ORAL

## 2023-01-21 NOTE — Progress Notes (Signed)
 PT is here for std treatment, treated per provider order. Instructed pt to call clinic if vomit occurs within 2 hours. Gaspar Garbe, RN

## 2023-01-22 ENCOUNTER — Encounter: Payer: Self-pay | Admitting: Nurse Practitioner

## 2023-01-22 NOTE — Progress Notes (Signed)
 [facility] Department STI clinic [address], [address] [place] [address] Main phone: [phone]  STI screening visit  Subjective:  Harry Johnson is a 38 y.o. male being seen today for an STI screening visit. The patient reports they do not have symptoms.    Patient has the following medical conditions:   Patient Active Problem List   Diagnosis Date Noted   Reactive depression 07/08/2019   Constipation 12/08/2018   SOB (shortness of breath) 07/07/2018   Fatigue 05/21/2018   Situational stress 05/21/2018   Chronic seasonal allergic rhinitis due to pollen     Chief Complaint  Patient presents with   SEXUALLY TRANSMITTED DISEASE    Screening.      Patient is a 38 y.o. male who presents to the clinic for asymptomatic STI testing and for repeat treatment of chlamydia. The patient indicates he did not complete the last course of Doxy he was given for chlamydia. He indicates one male partner in the past 2 months, uses condoms sometimes, has penile/vaginal penetrative sex and a history of chlamydia.    Last HIV test per patient/review of record was  Lab Results  Component Value Date   HMHIVSCREEN Negative - Validated 12/21/2021    Lab Results  Component Value Date   HIV NON-REACTIVE 08/23/2020    Last HEPC test per patient/review of record was  Lab Results  Component Value Date   HMHEPCSCREEN Negative-Validated 12/21/2021   No components found for: HEPC   Last HEPB test per patient/review of record was No components found for: HMHEPBSCREEN   Does the patient or their partner desires a pregnancy in the next year? No  Screening for MPX risk: Does the patient have an unexplained rash? No Is the patient MSM? No Does the patient endorse multiple sex partners or anonymous sex partners? No Did the patient have close or sexual contact with a person diagnosed with MPX? No Has the patient traveled outside the [place]  where MPX is endemic?  No Is there a high clinical suspicion for MPX-- evidenced by one of the following No  -Unlikely to be chickenpox  -Lymphadenopathy  -Rash that present in same phase of evolution on any given body part   See flowsheet for further details and programmatic requirements.   Immunization History  Administered Date(s) Administered   Hepatitis B, PED/ADOLESCENT 10/31/1997, 11/28/1997, 05/01/1998   MMR 02/03/1990   Td 01/15/2015     The following portions of the patient's history were reviewed and updated as appropriate: allergies, current medications, past medical history, past social history, past surgical history and problem list.  Objective:  There were no vitals filed for this visit.  Physical Exam Nursing note reviewed.  Constitutional:      Appearance: Normal appearance.  HENT:     Head: Normocephalic.     Salivary Glands: Right salivary gland is not diffusely enlarged or tender. Left salivary gland is not diffusely enlarged or tender.     Mouth/Throat:     Lips: Pink. No lesions.     Mouth: Mucous membranes are moist. No oral lesions.     Tongue: No lesions. Tongue does not deviate from midline.     Pharynx: Oropharynx is clear. Uvula midline. No oropharyngeal exudate or posterior oropharyngeal erythema.     Tonsils: No tonsillar exudate.  Eyes:     General:        Right eye: No discharge.        Left eye: No discharge.  Conjunctiva/sclera:     Right eye: Right conjunctiva is not injected. No exudate.    Left eye: Left conjunctiva is not injected. No exudate. Pulmonary:     Effort: Pulmonary effort is normal.  Genitourinary:    Comments: Patient asymptomatic and declined genital exam.  Lymphadenopathy:     Head:     Right side of head: No submental, submandibular, tonsillar, preauricular or posterior auricular adenopathy.     Left side of head: No submental, submandibular, tonsillar, preauricular or posterior auricular adenopathy.     Cervical: No cervical  adenopathy.     Right cervical: No superficial or posterior cervical adenopathy.    Left cervical: No superficial or posterior cervical adenopathy.     Upper Body:     Right upper body: No supraclavicular or axillary adenopathy.     Left upper body: No supraclavicular or axillary adenopathy.  Skin:    General: Skin is warm and dry.     Findings: No rash.     Comments: Skin tone appropriate for ethnicity. Exposed areas only.   Neurological:     Mental Status: He is alert and oriented to person, place, and time.  Psychiatric:        Attention and Perception: Attention normal.        Mood and Affect: Mood and affect normal.        Speech: Speech normal.        Behavior: Behavior normal. Behavior is cooperative.        Thought Content: Thought content normal.     Assessment and Plan:  Harry Johnson is a 38 y.o. male presenting to the [facility] for STI screening  1. Screening for venereal disease (Primary) Other screening tests declined.  - Chlamydia/GC NAA, Confirmation  2. Non compliance with medical treatment Patient reports not taking Rx for chlamydia treatment as indicated. Returns to clinic today for asymptomatic testing and repeat treatment. Treatment provided as one time dose of 1000mg  of Azithromycin  orally. Urine for GC/chlamydia ordered. All of there screening tests declined.   3. Chlamydia  - azithromycin  (ZITHROMAX ) tablet 1,000 mg   Patient does not have STI symptoms Patient accepted urine GC/Chlamydia screening only.  Patient meets criteria for HepB screening? No. Ordered? not applicable Patient meets criteria for HepC screening? No. Ordered? not applicable Recommended condom use with all sex Discussed importance of condom use for STI prevention  Treat positive test results per standing order. Discussed time line for State Lab results and that patient will be called with positive results and encouraged patient to call if he had not  heard in 2 weeks Recommended repeat testing in 3 months with positive results. Recommended returning for continued or worsening symptoms.   Return if symptoms worsen or fail to improve.  No future appointments.  Total time with patient 20 minutes.   Clarita LITTIE Narrow, NP

## 2023-01-23 LAB — CHLAMYDIA/GC NAA, CONFIRMATION
Chlamydia trachomatis, NAA: NEGATIVE
Neisseria gonorrhoeae, NAA: NEGATIVE

## 2023-02-18 ENCOUNTER — Encounter: Payer: Self-pay | Admitting: Nurse Practitioner

## 2023-02-18 ENCOUNTER — Ambulatory Visit: Payer: Self-pay | Admitting: Nurse Practitioner

## 2023-02-18 DIAGNOSIS — Z113 Encounter for screening for infections with a predominantly sexual mode of transmission: Secondary | ICD-10-CM

## 2023-02-18 NOTE — Progress Notes (Signed)
 Pt is here for STD screening. Condoms declined. Sonda Primes, RN.

## 2023-02-20 NOTE — Progress Notes (Addendum)
 [facility] Department STI clinic [address] [place] [address] Main phone: [phone]  STI screening visit  Subjective:  Harry Johnson is a 38 y.o. male being seen today for an STI screening visit. The patient reports they do not have symptoms.    Patient has the following medical conditions:  Patient Active Problem List   Diagnosis Date Noted   Reactive depression 07/08/2019   Constipation 12/08/2018   SOB (shortness of breath) 07/07/2018   Fatigue 05/21/2018   Situational stress 05/21/2018   Chronic seasonal allergic rhinitis due to pollen     Chief Complaint  Patient presents with   SEXUALLY TRANSMITTED DISEASE    Pt is here for STD Screening and has no symptoms    HPI Patient is a 38 y.o. male who presented to the clinic requesting asymptomatic STI testing. He reports 1 male partner in the last 2 months, practices penile/vaginal penetrative sex, and uses condoms sometimes. Pt reported last sex was 02/04/23. He has a history of chlamydia from 2024 that he states he did not complete treatment for; however, in January 2025 pt returned for STI testing and was retreated with one time dose of Azithromycin . Today he is wanting TOC.    STI screening history: Last HIV test per patient/review of record was  Lab Results  Component Value Date   HMHIVSCREEN Negative - Validated 12/21/2021    Lab Results  Component Value Date   HIV NON-REACTIVE 08/23/2020    Last HEPC test per patient/review of record was  Lab Results  Component Value Date   HMHEPCSCREEN Negative-Validated 12/21/2021   No components found for: HEPC   Last HEPB test per patient/review of record was No components found for: HMHEPBSCREEN   Fertility: Does the patient or their partner desires a pregnancy in the next year? No  Screening for MPX risk: Does the patient have an unexplained rash? No Is the patient MSM? No Does the patient endorse multiple sex  partners or anonymous sex partners? No Did the patient have close or sexual contact with a person diagnosed with MPX? No Has the patient traveled outside the [place]  where MPX is endemic? No Is there a high clinical suspicion for MPX-- evidenced by one of the following No  -Unlikely to be chickenpox  -Lymphadenopathy  -Rash that present in same phase of evolution on any given body part   See flowsheet for further details and programmatic requirements.   Immunization History  Administered Date(s) Administered   Hepatitis B, PED/ADOLESCENT 10/31/1997, 11/28/1997, 05/01/1998   MMR 02/03/1990   Td 01/15/2015     The following portions of the patient's history were reviewed and updated as appropriate: allergies, current medications, past medical history, past social history, past surgical history and problem list.  Objective:  There were no vitals filed for this visit.  Physical Exam Nursing note reviewed.  Constitutional:      Appearance: Normal appearance.  HENT:     Head: Normocephalic.     Salivary Glands: Right salivary gland is not diffusely enlarged or tender. Left salivary gland is not diffusely enlarged or tender.     Mouth/Throat:     Lips: Pink. No lesions.     Mouth: Mucous membranes are moist. No oral lesions.     Tongue: No lesions. Tongue does not deviate from midline.     Pharynx: Oropharynx is clear. Uvula midline. No oropharyngeal exudate or posterior oropharyngeal erythema.     Tonsils: No tonsillar exudate.  Eyes:     General:        Right eye: No discharge.        Left eye: No discharge.     Conjunctiva/sclera:     Right eye: Right conjunctiva is not injected. No exudate.    Left eye: Left conjunctiva is not injected. No exudate. Pulmonary:     Effort: Pulmonary effort is normal.  Genitourinary:    Comments: Patient asymptomatic and declines genital exam.  Lymphadenopathy:     Head:     Right side of head: No submental, submandibular, tonsillar,  preauricular or posterior auricular adenopathy.     Left side of head: No submental, submandibular, tonsillar, preauricular or posterior auricular adenopathy.     Cervical: No cervical adenopathy.     Right cervical: No superficial or posterior cervical adenopathy.    Left cervical: No superficial or posterior cervical adenopathy.     Upper Body:     Right upper body: No supraclavicular or axillary adenopathy.     Left upper body: No supraclavicular or axillary adenopathy.  Skin:    General: Skin is warm and dry.     Findings: No lesion or rash.     Comments: Skin tone appropriate for ethnicity. Assessed exposed areas and back only.   Neurological:     Mental Status: He is alert and oriented to person, place, and time.  Psychiatric:        Attention and Perception: Attention and perception normal.        Mood and Affect: Mood and affect normal.        Speech: Speech normal.        Behavior: Behavior normal. Behavior is cooperative.        Thought Content: Thought content normal.     Assessment and Plan:  [name] is a 38 y.o. male presenting to the [facility] Department for STI screening  1. Screening for venereal disease (Primary) TOC per pt request. Will call if result is positive. Pt aware.  - Chlamydia/GC NAA, Confirmation   Patient does not have STI symptoms Patient only accepted urine GC/Chlamydia screenings. Patient meets criteria for HepB screening? No. Ordered? not applicable Patient meets criteria for HepC screening? No. Ordered? not applicable Recommended condom use with all sex Discussed importance of condom use for STI prevention  Treat positive test results per standing order. Discussed time line for State Lab results and that patient will be called with positive results and encouraged patient to call if he had not heard in 2 weeks Recommended repeat testing in 3 months with positive results. Recommended returning for continued or worsening  symptoms.   Return if symptoms worsen or fail to improve.  No future appointments.  Total time with patient 20 minutes.   Clarita LITTIE Narrow, NP

## 2023-02-22 LAB — CHLAMYDIA/GC NAA, CONFIRMATION
Chlamydia trachomatis, NAA: NEGATIVE
Neisseria gonorrhoeae, NAA: NEGATIVE

## 2023-06-04 ENCOUNTER — Ambulatory Visit: Payer: Self-pay | Admitting: Nurse Practitioner

## 2023-06-04 ENCOUNTER — Encounter: Payer: Self-pay | Admitting: Nurse Practitioner

## 2023-06-04 DIAGNOSIS — Z113 Encounter for screening for infections with a predominantly sexual mode of transmission: Secondary | ICD-10-CM

## 2023-06-04 NOTE — Progress Notes (Signed)
 Pt is here for STD screening. Condoms declined. Sonda Primes, RN.

## 2023-06-04 NOTE — Progress Notes (Signed)
 [facility] Department STI clinic [address] [place] [address] Main phone: [phone]  STI screening visit  Subjective:  Harry Johnson is a 38 y.o. male being seen today for an STI screening visit. The patient reports they do not have symptoms.    Patient has the following medical conditions:  Patient Active Problem List   Diagnosis Date Noted   Reactive depression 07/08/2019   Constipation 12/08/2018   SOB (shortness of breath) 07/07/2018   Fatigue 05/21/2018   Situational stress 05/21/2018   Chronic seasonal allergic rhinitis due to pollen    Chief Complaint  Patient presents with   SEXUALLY TRANSMITTED DISEASE    HPI Patient is a pleasant 38 y.o. male who presents to the office today requesting asymptomatic STI testing.   He reports 2 male partners in the last 2 months, and practices penile/vaginal penetrative sex.. Patient reports using condoms sometimes. Patient indicates a history of chlamydia in 2024. He reports last sex was 05/21/23.   See flowsheet for further details and programmatic requirements  Hyperlink available at the top of the signed note in blue.  Flow sheet content below:  Pregnancy Intention Screening Does the patient want to become pregnant in the next year?: No Does the patient's partner want to become pregnant in the next year?: No Would the patient like to discuss contraceptive options today?: N/A All Patients Anyone smoke around pt and/or pt's children?: No Anyone smoke inside pt's house?: No Anyone smoke inside car?: No Anyone smoke inside the workplace?: No Reason For STD Screen STD Screening: Is asymptomatic Have you ever had an STD?: Yes History of Antibiotic use in the past 2 weeks?: No STD Symptoms Denies all: Yes Risk Factors for Hep B Household, sexual, or needle sharing contact of a person infected with Hep B: No Sexual contact with a person who uses drugs not as prescribed?:  No Currently or Ever used drugs not as prescribed: No HIV Positive: No PRep Patient: No Men who have sex with men: N/A Have Hepatitis C: No History of Incarceration: No History of Homeslessness?: No Anal sex following anal drug use?: N/A Risk Factors for Hep C Currently using drugs not as prescribed: No Sexual partner(s) currently using drugs as not prescribed: No History of drug use: No HIV Positive: No People with a history of incarceration: No People born between the years of 4 and 45: No Abuse History Has patient ever been abused physically?: No Has patient ever been abused sexually?: No Does patient feel they have a problem with Anxiety?: No Does patient feel they have a problem with Depression?: No Referral to Behavioral Health: No Counseling Patient counseled to use condoms with all sex: Condoms declined RTC in 2-3 weeks for test results: Yes Clinic will call if test results abnormal before test result appt.: Yes Test results given to patient Patient counseled to use condoms with all sex: Condoms declined Contraception Wrap Up Current Method: Male Condom End Method: Male Condom Contraception Counseling Provided: Yes How was the end contraceptive method provided?: N/A (Declined)  Screening for MPX risk: Does the patient have an unexplained rash? No Is the patient MSM? No Does the patient endorse multiple sex partners or anonymous sex partners? Yes Did the patient have close or sexual contact with a person diagnosed with MPX? No Has the patient traveled outside the [place]  where MPX is endemic? No Is there a high clinical suspicion for MPX-- evidenced by one of the following No  -Unlikely  to be chickenpox  -Lymphadenopathy  -Rash that present in same phase of evolution on any given body part  STI screening history: Last HIV test per patient/review of record was  Lab Results  Component Value Date   HMHIVSCREEN Negative - Validated 12/21/2021    Lab Results   Component Value Date   HIV NON-REACTIVE 08/23/2020    Last HEPC test per patient/review of record was  Lab Results  Component Value Date   HMHEPCSCREEN Negative-Validated 12/21/2021   No components found for: "HEPC"   Last HEPB test per patient/review of record was No components found for: "HMHEPBSCREEN"   Fertility: Does the patient or their partner desires a pregnancy in the next year? No  Immunization History  Administered Date(s) Administered   Hepatitis B, PED/ADOLESCENT 10/31/1997, 11/28/1997, 05/01/1998   MMR 02/03/1990   Td 01/15/2015    The following portions of the patient's history were reviewed and updated as appropriate: allergies, current medications, past medical history, past social history, past surgical history and problem list.  Objective:  There were no vitals filed for this visit.  Physical Exam Nursing note reviewed.  Constitutional:      Appearance: Normal appearance.  HENT:     Head: Normocephalic.     Salivary Glands: Right salivary gland is not diffusely enlarged or tender. Left salivary gland is not diffusely enlarged or tender.     Mouth/Throat:     Lips: Pink. No lesions.     Mouth: Mucous membranes are moist. No oral lesions.     Tongue: No lesions. Tongue does not deviate from midline.     Pharynx: Oropharynx is clear. Uvula midline. No oropharyngeal exudate or posterior oropharyngeal erythema.     Tonsils: No tonsillar exudate.  Eyes:     General:        Right eye: No discharge.        Left eye: No discharge.     Conjunctiva/sclera:     Right eye: Right conjunctiva is not injected. No exudate.    Left eye: Left conjunctiva is not injected. No exudate. Pulmonary:     Effort: Pulmonary effort is normal.  Genitourinary:    Comments: Patient asymptomatic and declines genital exam.  Lymphadenopathy:     Head:     Right side of head: No submental, submandibular, tonsillar, preauricular or posterior auricular adenopathy.     Left side of  head: No submental, submandibular, tonsillar, preauricular or posterior auricular adenopathy.     Cervical: No cervical adenopathy.     Right cervical: No superficial or posterior cervical adenopathy.    Left cervical: No superficial or posterior cervical adenopathy.     Upper Body:     Right upper body: No supraclavicular or axillary adenopathy.     Left upper body: No supraclavicular or axillary adenopathy.  Skin:    General: Skin is warm and dry.     Findings: No lesion or rash.     Comments: Skin tone appropriate for ethnicity. Assessed exposed areas and back only.   Neurological:     Mental Status: He is alert and oriented to person, place, and time.  Psychiatric:        Attention and Perception: Attention and perception normal.        Mood and Affect: Mood and affect normal.        Speech: Speech normal.        Behavior: Behavior normal. Behavior is cooperative.        Thought Content: Thought  content normal.     Assessment and Plan:  Harry Johnson is a 38 y.o. male presenting to the [facility] Department for STI screening  1. Screening for venereal disease (Primary) Patient declines blood work to test for STI today. Last blood test for HBV/HCV/HIV/Syphilis in 12/2021.  Also declines oral swab for gonorrhea.  Should plan to offer patient blood testing for STI at next visit and strongly encourage since he has multiple partners and it has been over 1 year since last tested.  - Chlamydia/GC NAA, Confirmation   Patient does not have STI symptoms Patient accepted the following screenings: urine GC/Chlamydia. Patient meets criteria for HepB screening? Yes. Ordered? No-Declined Patient meets criteria for HepC screening? Yes. Ordered? No-Declined Recommended condom use with all sex Discussed importance of condom use for STI prevention  Treat positive test results per standing order. Discussed time line for State Lab results and that patient will be called with  positive results and encouraged patient to call if he had not heard in 2 weeks Recommended repeat testing in 3 months with positive results. Recommended returning for continued or worsening symptoms.   Return if symptoms worsen or fail to improve.  No future appointments.  Total time with patient 15 minutes.   Merleen Stare, NP

## 2023-06-07 LAB — CHLAMYDIA/GC NAA, CONFIRMATION
Chlamydia trachomatis, NAA: NEGATIVE
Neisseria gonorrhoeae, NAA: NEGATIVE

## 2023-07-11 ENCOUNTER — Ambulatory Visit: Payer: Self-pay | Admitting: Family Medicine

## 2023-07-11 DIAGNOSIS — Z202 Contact with and (suspected) exposure to infections with a predominantly sexual mode of transmission: Secondary | ICD-10-CM

## 2023-07-11 DIAGNOSIS — Z113 Encounter for screening for infections with a predominantly sexual mode of transmission: Secondary | ICD-10-CM

## 2023-07-11 MED ORDER — DOXYCYCLINE HYCLATE 100 MG PO TABS: 100.0000 mg | ORAL_TABLET | Freq: Two times a day (BID) | ORAL | Status: AC

## 2023-07-11 NOTE — Patient Instructions (Addendum)
 STI screening - Today we obtained a urine sample to screen for gonorrhea and chlamydia - If the results are abnormal, I will give you a call.    Estimated time frame for results collected at the [facility] Department: Same day Trichomonas Yeast BV (bacterial vaginosis)  Within 1-2 weeks Gonorrhea Chlamydia  Within 2-3 weeks HIV Syphilis Hepatitis B Hepatitis C    Chlamydia contact Please take your doxycycline  as prescribed.  Do NOT have sex until after you have completed your doxycycline .

## 2023-07-11 NOTE — Progress Notes (Signed)
 [facility] Department STI clinic [address], [address] [place] [address] Main phone: [phone]  STI screening visit  Subjective:  Harry Johnson is a 38 y.o. male being seen today for an STI screening visit. The patient reports they do not have symptoms.    Patient has the following medical conditions:  Patient Active Problem List   Diagnosis Date Noted   Chlamydia contact 08/23/2020   Reactive depression 07/08/2019   Constipation 12/08/2018   SOB (shortness of breath) 07/07/2018   Fatigue 05/21/2018   Situational stress 05/21/2018   Chronic seasonal allergic rhinitis due to pollen    Chief Complaint  Patient presents with   SEXUALLY TRANSMITTED DISEASE   HPI Patient reports he is a contact to chlamydia and would like STI treatment. Asymptomatic.   See flowsheet for further details and programmatic requirements  Hyperlink available at the top of the signed note in blue.  Flow sheet content below:  Pregnancy Intention Screening Does the patient want to become pregnant in the next year?: No Does the patient's partner want to become pregnant in the next year?: No Would the patient like to discuss contraceptive options today?: N/A All Patients Anyone smoke around pt and/or pt's children?: No Anyone smoke inside pt's house?: No Anyone smoke inside car?: No Anyone smoke inside the workplace?: No Reason For STD Screen STD Screening: Is asymptomatic Have you ever had an STD?: Yes History of Antibiotic use in the past 2 weeks?: No STD Symptoms Denies all: Yes Risk Factors for Hep B Household, sexual, or needle sharing contact of a person infected with Hep B: No Sexual contact with a person who uses drugs not as prescribed?: No Currently or Ever used drugs not as prescribed: No HIV Positive: No PRep Patient: No Men who have sex with men: No Have Hepatitis C: No History of Incarceration: No History of Homeslessness?: No Anal sex  following anal drug use?: N/A Risk Factors for Hep C Currently using drugs not as prescribed: No Sexual partner(s) currently using drugs as not prescribed: No History of drug use: No HIV Positive: No People with a history of incarceration: No People born between the years of 3 and 82: No Abuse History Has patient ever been abused physically?: No Has patient ever been abused sexually?: No Does patient feel they have a problem with Anxiety?: No Does patient feel they have a problem with Depression?: No Referral to Behavioral Health: No Counseling Medication side effects discussed with patient?: Yes Contact card(s) given to patient: No Patient counseled to abstain from sex for: 7 days Patient counseled to use condoms with all sex: Condoms declined RTC in 2-3 weeks for test results: Yes Clinic will call if test results abnormal before test result appt.: Yes Patient should return to the clinic for re-treatment if vomits within 2 hours after taking meds   : Yes Test results given to patient Patient counseled to use condoms with all sex: Condoms declined STD Treatment Patient counseled to abstain from sex for: 7 days  Screening for MPX risk: Does the patient have an unexplained rash? No Is the patient MSM? No Does the patient endorse multiple sex partners or anonymous sex partners? No Did the patient have close or sexual contact with a person diagnosed with MPX? No Has the patient traveled outside the [place]  where MPX is endemic? No Is there a high clinical suspicion for MPX-- evidenced by one of the following No  -Unlikely to be chickenpox  -Lymphadenopathy  -  Rash that present in same phase of evolution on any given body part  STI screening history: Last HIV test per patient/review of record was  Lab Results  Component Value Date   HMHIVSCREEN Negative - Validated 12/21/2021    Lab Results  Component Value Date   HIV NON-REACTIVE 08/23/2020   Last HEPC test per  patient/review of record was  Lab Results  Component Value Date   HMHEPCSCREEN Negative-Validated 12/21/2021   No components found for: HEPC   Last HEPB test per patient/review of record was No components found for: HMHEPBSCREEN   Immunization History  Administered Date(s) Administered   Hepatitis B, PED/ADOLESCENT 10/31/1997, 11/28/1997, 05/01/1998   MMR 02/03/1990   Td 01/15/2015    The following portions of the patient's history were reviewed and updated as appropriate: allergies, current medications, past medical history, past social history, past surgical history and problem list.  Objective:  There were no vitals filed for this visit.  Physical Exam Vitals and nursing note reviewed.  Constitutional:      Appearance: Normal appearance.  HENT:     Head: Normocephalic and atraumatic.     Mouth/Throat:     Mouth: Mucous membranes are moist.     Pharynx: No oropharyngeal exudate or posterior oropharyngeal erythema.   Eyes:     General: No scleral icterus.       Right eye: No discharge.        Left eye: No discharge.     Conjunctiva/sclera:     Right eye: Right conjunctiva is not injected. No exudate.    Left eye: Left conjunctiva is not injected. No exudate.  Pulmonary:     Effort: Pulmonary effort is normal.  Abdominal:     Palpations: There is no hepatomegaly.  Genitourinary:    Comments: Declined genital exam- asymptomatic  Musculoskeletal:     Cervical back: Neck supple. No rigidity or tenderness.  Lymphadenopathy:     Cervical: No cervical adenopathy.     Right cervical: No superficial or posterior cervical adenopathy.    Left cervical: No superficial or posterior cervical adenopathy.     Upper Body:     Right upper body: No supraclavicular adenopathy.     Left upper body: No supraclavicular adenopathy.   Skin:    General: Skin is warm and dry.     Capillary Refill: Capillary refill takes less than 2 seconds.     Coloration: Skin is not jaundiced or  pale.     Findings: No bruising, erythema, lesion or rash.   Neurological:     General: No focal deficit present.     Mental Status: He is alert and oriented to person, place, and time.   Psychiatric:        Mood and Affect: Mood normal.        Behavior: Behavior normal.    Assessment and Plan:  Harry Johnson is a 38 y.o. male presenting to the [facility] Department for STI screening  Screening examination for venereal disease -     Chlamydia/GC NAA, Confirmation  Chlamydia contact -     Doxycycline  Hyclate; Take 1 tablet (100 mg total) by mouth 2 (two) times daily for 7 days.  The patient was dispensed doxycycline  today. I provided counseling today regarding the medication. We discussed the medication, the side effects and when to call clinic. Patient given the opportunity to ask questions. Questions answered.   Patient does not have STI symptoms Patient accepted the following screenings: urine CT/GC  Patient meets criteria for HepB screening? No. Ordered? not applicable Patient meets criteria for HepC screening? No. Ordered? not applicable Recommended condom use with all sex Discussed importance of condom use for STI prevention  Treat positive test results per standing order. Discussed time line for State Lab results and that patient will be called with positive results and encouraged patient to call if he had not heard in 2 weeks Recommended repeat testing in 3 months with positive results. Recommended returning for continued or worsening symptoms.   Return if symptoms worsen or fail to improve.  No future appointments.  History and physical completed by Damien Satchel, [facility] I was present for the counseling portion.  Betsey CHRISTELLA Helling, MD

## 2023-07-13 NOTE — Progress Notes (Signed)
Chart reviewed by Pharmacist  Suzanne Walker PharmD, Contract Pharmacist at [facility]  

## 2023-07-14 ENCOUNTER — Ambulatory Visit (HOSPITAL_COMMUNITY)
Admission: EM | Admit: 2023-07-14 | Discharge: 2023-07-14 | Disposition: A | Discharge status: Home / Self Care | Payer: Self-pay

## 2023-07-14 ENCOUNTER — Encounter (HOSPITAL_COMMUNITY): Payer: Self-pay

## 2023-07-14 DIAGNOSIS — S161XXA Strain of muscle, fascia and tendon at neck level, initial encounter: Secondary | ICD-10-CM

## 2023-07-14 DIAGNOSIS — S39012A Strain of muscle, fascia and tendon of lower back, initial encounter: Secondary | ICD-10-CM

## 2023-07-14 MED ORDER — BACLOFEN 10 MG PO TABS: 10.0000 mg | ORAL_TABLET | Freq: Three times a day (TID) | ORAL | 0 refills | Status: AC

## 2023-07-14 MED ORDER — DICLOFENAC SODIUM 50 MG PO TBEC: 50.0000 mg | DELAYED_RELEASE_TABLET | Freq: Two times a day (BID) | ORAL | 1 refills | Status: AC

## 2023-07-14 NOTE — Discharge Instructions (Signed)
  1. Strain of lumbar region, initial encounter (Primary) 2. Strain of neck muscle, initial encounter - diclofenac (VOLTAREN) 50 MG EC tablet; Take 1 tablet (50 mg total) by mouth 2 (two) times daily.  Dispense: 30 tablet; Refill: 1 - baclofen (LIORESAL) 10 MG tablet; Take 1 tablet (10 mg total) by mouth 3 (three) times daily.  Dispense: 30 each; Refill: 0 - Do not take baclofen if you have to drive or work as it may cause drowsiness and impairment - Apply heat to the lower back and left posterior neck for muscle spasm and pain - No heavy lifting or strenuous physical activity for the next 48 to 72 hours until symptoms improve. - Continue to monitor current symptoms for any change in severity if there is any escalation of your current symptoms or development of new symptoms follow-up for further evaluation and management.

## 2023-07-14 NOTE — ED Provider Notes (Signed)
 [facility]  Note:  This document was prepared using Dragon voice recognition software and may include unintentional dictation errors.  MRN: [id number] DOB: 1985/07/05  Subjective:   Harry Johnson is a 38 y.o. male presenting for left posterior neck pain and inflammation and bilateral lower back pain and inflammation x 2 weeks.  Patient denies any known injury, trauma, fall.  Patient denies any significant history of back or neck injury or pain.  Patient reports that muscles feel extremely tense and hurt with movement or bending forward.  Patient has tried over-the-counter ibuprofen and Biofreeze with no improvement.  Patient reports that his girlfriend stated that his muscles in his lower back were extremely tense and recommended he come in for evaluation.  No current facility-administered medications for this encounter.  Current Outpatient Medications:    baclofen (LIORESAL) 10 MG tablet, Take 1 tablet (10 mg total) by mouth 3 (three) times daily., Disp: 30 each, Rfl: 0   diclofenac (VOLTAREN) 50 MG EC tablet, Take 1 tablet (50 mg total) by mouth 2 (two) times daily., Disp: 30 tablet, Rfl: 1   cetirizine (ZYRTEC) 10 MG tablet, Take 10 mg by mouth daily. (Patient not taking: Reported on 01/21/2023), Disp: , Rfl:    doxycycline  (VIBRA -TABS) 100 MG tablet, Take 1 tablet (100 mg total) by mouth 2 (two) times daily for 7 days., Disp: , Rfl:    HYDROcodone -acetaminophen  (NORCO/VICODIN) 5-325 MG tablet, Take 1 tablet by mouth every 6 (six) hours as needed for moderate pain. (Patient not taking: Reported on 01/21/2023), Disp: 12 tablet, Rfl: 0   ondansetron  (ZOFRAN -ODT) 4 MG disintegrating tablet, Take 1 tablet (4 mg total) by mouth every 8 (eight) hours as needed for nausea or vomiting. (Patient not taking: Reported on 01/21/2023), Disp: 12 tablet, Rfl: 0   No Known Allergies  Past Medical History:  Diagnosis Date   Chronic seasonal allergic rhinitis due to pollen      History  reviewed. No pertinent surgical history.  Family History  Problem Relation Age of Onset   Sarcoidosis Mother    Cancer Neg Hx    Diabetes Neg Hx    Heart disease Neg Hx     Social History   Tobacco Use   Smoking status: Never   Smokeless tobacco: Never  Vaping Use   Vaping status: Never Used  Substance Use Topics   Alcohol use: Not Currently    Alcohol/week: 1.0 standard drink of alcohol    Types: 1 Glasses of wine per week    Comment: monthly   Drug use: Never    ROS Refer to HPI for ROS details.  Objective:   Vitals: BP 118/73 (BP Location: Right Arm)   Pulse 68   Temp 98 F (36.7 C) (Oral)   Ht 5' 8 (1.727 m)   Wt 198 lb (89.8 kg)   SpO2 99%   BMI 30.11 kg/m   Physical Exam Vitals and nursing note reviewed.  Constitutional:      General: He is not in acute distress.    Appearance: Normal appearance. He is not ill-appearing or toxic-appearing.  HENT:     Head: Normocephalic.   Cardiovascular:     Rate and Rhythm: Normal rate.  Pulmonary:     Effort: Pulmonary effort is normal. No respiratory distress.   Musculoskeletal:     Cervical back: Rigidity, spasms and tenderness present. No swelling, deformity, erythema or bony tenderness. Pain with movement present. Decreased range of motion.     Lumbar back:  Spasms and tenderness present. No swelling, deformity, signs of trauma or bony tenderness. Decreased range of motion.   Skin:    General: Skin is warm and dry.     Capillary Refill: Capillary refill takes less than 2 seconds.   Neurological:     General: No focal deficit present.     Mental Status: He is alert and oriented to person, place, and time.   Psychiatric:        Mood and Affect: Mood normal.        Behavior: Behavior normal.     Procedures  No results found for this or any previous visit (from the past 24 hours).  No results found.   Assessment and Plan :     Discharge Instructions       1. Strain of lumbar region,  initial encounter (Primary) 2. Strain of neck muscle, initial encounter - diclofenac (VOLTAREN) 50 MG EC tablet; Take 1 tablet (50 mg total) by mouth 2 (two) times daily.  Dispense: 30 tablet; Refill: 1 - baclofen (LIORESAL) 10 MG tablet; Take 1 tablet (10 mg total) by mouth 3 (three) times daily.  Dispense: 30 each; Refill: 0 - Do not take baclofen if you have to drive or work as it may cause drowsiness and impairment - Apply heat to the lower back and left posterior neck for muscle spasm and pain - No heavy lifting or strenuous physical activity for the next 48 to 72 hours until symptoms improve. - Continue to monitor current symptoms for any change in severity if there is any escalation of your current symptoms or development of new symptoms follow-up for further evaluation and management.      Kaleiah Kutzer B Yarexi Pawlicki   Arietta Eisenstein, [place], [place] 07/14/23 1110

## 2023-07-14 NOTE — ED Triage Notes (Signed)
 Patient presenting with neck and back pain onset 2 weeks ago. No known falls or injuries. States unable to turn his neck to the right and lower back pain.   Prescriptions or OTC medications tried: Yes- Ibuprofen and biofreeze    with no relief

## 2023-07-15 LAB — SPECIMEN STATUS REPORT

## 2023-07-15 LAB — CHLAMYDIA/GC NAA, CONFIRMATION
Chlamydia trachomatis, NAA: NEGATIVE
Neisseria gonorrhoeae, NAA: NEGATIVE

## 2023-07-17 ENCOUNTER — Ambulatory Visit: Payer: Self-pay | Admitting: Family Medicine

## 2023-07-17 NOTE — Progress Notes (Signed)
 Negative G/C screening.   Dorothyann Helling, MD 07/17/23  2:57 PM

## 2023-08-27 ENCOUNTER — Ambulatory Visit: Payer: Self-pay | Admitting: Family Medicine

## 2023-08-27 DIAGNOSIS — Z202 Contact with and (suspected) exposure to infections with a predominantly sexual mode of transmission: Secondary | ICD-10-CM

## 2023-08-27 DIAGNOSIS — Z113 Encounter for screening for infections with a predominantly sexual mode of transmission: Secondary | ICD-10-CM

## 2023-08-27 MED ORDER — METRONIDAZOLE 500 MG PO TABS: 500.0000 mg | ORAL_TABLET | Freq: Two times a day (BID) | ORAL | Status: AC

## 2023-08-27 NOTE — Progress Notes (Signed)
 [facility] Department STI clinic [address], [address] [place] [address] Main phone: [phone]  STI screening visit  Subjective:  Harry Johnson is a 38 y.o. male being seen today for an STI screening visit. The patient reports they do not have symptoms.    Patient has the following medical conditions:  Patient Active Problem List   Diagnosis Date Noted   Chlamydia contact 08/23/2020   Reactive depression 07/08/2019   Constipation 12/08/2018   SOB (shortness of breath) 07/07/2018   Fatigue 05/21/2018   Situational stress 05/21/2018   Chronic seasonal allergic rhinitis due to pollen    Chief Complaint  Patient presents with   SEXUALLY TRANSMITTED DISEASE   HPI Patient reports he is an asymptomatic contact to trichomonas and would like treatment and testing. Declines blood work for HIV and RPR.   See flowsheet for further details and programmatic requirements  Hyperlink available at the top of the signed note in blue.  Flow sheet content below:  Pregnancy Intention Screening Does the patient want to become pregnant in the next year?: No Does the patient's partner want to become pregnant in the next year?: No Would the patient like to discuss contraceptive options today?: N/A All Patients Anyone smoke around pt and/or pt's children?: No Anyone smoke inside pt's house?: No Anyone smoke inside car?: No Anyone smoke inside the workplace?: No Reason For STD Screen STD Screening: Is a contact Have you ever had an STD?: Yes History of Antibiotic use in the past 2 weeks?: No STD Symptoms Denies all: Yes Risk Factors for Hep B Household, sexual, or needle sharing contact of a person infected with Hep B: No Sexual contact with a person who uses drugs not as prescribed?: No Currently or Ever used drugs not as prescribed: No HIV Positive: No PRep Patient: No Men who have sex with men: N/A Have Hepatitis C: No History of Incarceration:  No History of Homeslessness?: No Anal sex following anal drug use?: N/A Risk Factors for Hep C Currently using drugs not as prescribed: No Sexual partner(s) currently using drugs as not prescribed: No History of drug use: No HIV Positive: No People with a history of incarceration: No People born between the years of 48 and 41: No Abuse History Has patient ever been abused physically?: No Has patient ever been abused sexually?: No Does patient feel they have a problem with Anxiety?: No Does patient feel they have a problem with Depression?: No Referral to Behavioral Health: No Counseling Patient counseled to use condoms with all sex: Condoms declined RTC in 2-3 weeks for test results: Yes Clinic will call if test results abnormal before test result appt.: Yes Test results given to patient Patient counseled to use condoms with all sex: Condoms declined  Screening for MPX risk: Does the patient have an unexplained rash? No Is the patient MSM? No Does the patient endorse multiple sex partners or anonymous sex partners? Yes Did the patient have close or sexual contact with a person diagnosed with MPX? No Has the patient traveled outside the [place]  where MPX is endemic? No Is there a high clinical suspicion for MPX-- evidenced by one of the following No  -Unlikely to be chickenpox  -Lymphadenopathy  -Rash that present in same phase of evolution on any given body part  STI screening history: Last HIV test per patient/review of record was  Lab Results  Component Value Date   HMHIVSCREEN Negative - Validated 12/21/2021    Lab Results  Component Value Date   HIV NON-REACTIVE 08/23/2020    Last HEPC test per patient/review of record was  Lab Results  Component Value Date   HMHEPCSCREEN Negative-Validated 12/21/2021   No components found for: HEPC   Last HEPB test per patient/review of record was No components found for: HMHEPBSCREEN   Fertility: Does the patient or  their partner desires a pregnancy in the next year? No  Immunization History  Administered Date(s) Administered   Hepatitis B, PED/ADOLESCENT 10/31/1997, 11/28/1997, 05/01/1998   MMR 02/03/1990   Td 01/15/2015    The following portions of the patient's history were reviewed and updated as appropriate: allergies, current medications, past medical history, past social history, past surgical history and problem list.  Objective:  There were no vitals filed for this visit.  Physical Exam Vitals and nursing note reviewed.  Constitutional:      Appearance: Normal appearance.  HENT:     Head: Normocephalic and atraumatic.     Mouth/Throat:     Mouth: Mucous membranes are moist.     Pharynx: No oropharyngeal exudate or posterior oropharyngeal erythema.  Eyes:     General: No scleral icterus.       Right eye: No discharge.        Left eye: No discharge.     Conjunctiva/sclera:     Right eye: Right conjunctiva is not injected. No exudate.    Left eye: Left conjunctiva is not injected. No exudate. Pulmonary:     Effort: Pulmonary effort is normal.  Abdominal:     Palpations: There is no hepatomegaly.  Genitourinary:    Comments: Declined genital exam - no discharge, no swabs needed Musculoskeletal:     Cervical back: Neck supple. No rigidity or tenderness.  Lymphadenopathy:     Head:     Right side of head: No submandibular, preauricular or posterior auricular adenopathy.     Left side of head: No submandibular, preauricular or posterior auricular adenopathy.     Cervical: No cervical adenopathy.     Right cervical: No superficial or posterior cervical adenopathy.    Left cervical: No superficial or posterior cervical adenopathy.     Upper Body:     Right upper body: No supraclavicular adenopathy.     Left upper body: No supraclavicular adenopathy.  Skin:    General: Skin is warm and dry.     Capillary Refill: Capillary refill takes less than 2 seconds.     Coloration: Skin is  not jaundiced or pale.     Findings: No bruising, erythema, lesion or rash.  Neurological:     General: No focal deficit present.     Mental Status: He is alert and oriented to person, place, and time.  Psychiatric:        Mood and Affect: Mood normal.        Behavior: Behavior normal.    Assessment and Plan:  Harry Johnson is a 38 y.o. male presenting to the [facility] Department for STI screening  Trichomonas contact -     metroNIDAZOLE ; Take 1 tablet (500 mg total) by mouth 2 (two) times daily for 7 days.  Screening examination for venereal disease -     Chlamydia/GC NAA, Confirmation   Patient does not have STI symptoms Patient accepted the following screenings: urine CT/GC Patient meets criteria for HepB screening? No. Ordered? not applicable Patient meets criteria for HepC screening? No. Ordered? not applicable Recommended condom use with all sex Discussed importance of condom  use for STI prevention  Treat positive test results per standing order. Discussed time line for State Lab results and that patient will be called with positive results and encouraged patient to call if he had not heard in 2 weeks Recommended repeat testing in 3 months with positive results. Recommended returning for continued or worsening symptoms.   No follow-ups on file.  Future Appointments  Date Time Provider Department Center  08/27/2023  9:05 AM Macario Dorothyann HERO, MD [facility] None    Betsey HERO Macario, MD

## 2023-08-27 NOTE — Patient Instructions (Signed)
 Trichomonas contact Take your metronidazole  twice daily for a full 7 days.  Do not have sex until after you and your partner finish your treatment fully.   STI screening - Today we obtained a urine sample to screen for gonorrhea and chlamydia - If the results are abnormal, I will give you a call.    Estimated time frame for results collected at the [facility] Department: Same day Trichomonas Yeast BV (bacterial vaginosis)  Within 1-2 weeks Gonorrhea Chlamydia  Within 1-2 weeks HIV Syphilis Hepatitis B Hepatitis C

## 2023-08-27 NOTE — Progress Notes (Signed)
 Patient here for STD screening. Patient dispensed #14 Metronidazole  BID x 7 days per order by JAYSON Helling, MD. I provided counseling today regarding the medication. We discussed the medication, the side effects and when to call clinic. Condoms declined. All questions answered and verbalizes understanding.   Doyce CINDERELLA Shuck, RN

## 2023-08-30 LAB — CHLAMYDIA/GC NAA, CONFIRMATION
Chlamydia trachomatis, NAA: NEGATIVE
Neisseria gonorrhoeae, NAA: NEGATIVE

## 2023-09-16 ENCOUNTER — Ambulatory Visit: Payer: Self-pay

## 2023-09-16 DIAGNOSIS — Z113 Encounter for screening for infections with a predominantly sexual mode of transmission: Secondary | ICD-10-CM

## 2023-09-16 DIAGNOSIS — Z202 Contact with and (suspected) exposure to infections with a predominantly sexual mode of transmission: Secondary | ICD-10-CM

## 2023-09-16 MED ORDER — DOXYCYCLINE HYCLATE 100 MG PO TABS: 100.0000 mg | ORAL_TABLET | Freq: Two times a day (BID) | ORAL | Status: AC

## 2023-09-16 NOTE — Addendum Note (Signed)
 Addended by: ROSABEL PERKINS E on: 09/16/2023 01:57 PM   Modules accepted: Orders

## 2023-09-16 NOTE — Progress Notes (Addendum)
 [facility] Department STI clinic [address], [address] [place] [address] Main phone: [phone]  STI screening visit  Subjective:  Harry Johnson is a 38 y.o. male being seen today for contact to chlamydia.   Patient has the following medical conditions:  Patient Active Problem List   Diagnosis Date Noted   Chlamydia contact 08/23/2020   Reactive depression 07/08/2019   Constipation 12/08/2018   SOB (shortness of breath) 07/07/2018   Fatigue 05/21/2018   Situational stress 05/21/2018   Chronic seasonal allergic rhinitis due to pollen    Chief Complaint  Patient presents with   SEXUALLY TRANSMITTED DISEASE   HPI Patient reports he was told by a sexual partner that she tested positive for chlamydia. He tested negative for gonorrhea and chlamydia on 08/27/23 when he came in for contact to trichomonas and was treated.   Today he only desires treatment for chlamydia contact, does not want testing for any other STIs. Declines HIV/RPR testing today. Discussed I would recommend repeat urine testing for gonorrhea/chlamydia despite negative test a few weeks ago, patient accepts.   See flowsheet for further details and programmatic requirements  Hyperlink available at the top of the signed note in blue.  Flow sheet content below:  Pregnancy Intention Screening Does the patient want to become pregnant in the next year?: No Does the patient's partner want to become pregnant in the next year?: No Would the patient like to discuss contraceptive options today?: N/A Counseling Patient counseled to use condoms with all sex: Condoms declined RTC in 2-3 weeks for test results: Yes Clinic will call if test results abnormal before test result appt.: Yes Test results given to patient Patient counseled to use condoms with all sex: Condoms declined  Screening for MPX risk: Does the patient have an unexplained rash? No Is the patient MSM? No Does the patient  endorse multiple sex partners or anonymous sex partners? No Did the patient have close or sexual contact with a person diagnosed with MPX? No Has the patient traveled outside the [place]  where MPX is endemic? No Is there a high clinical suspicion for MPX-- evidenced by one of the following No  -Unlikely to be chickenpox  -Lymphadenopathy  -Rash that present in same phase of evolution on any given body part  STI screening history: Last HIV test per patient/review of record was  Lab Results  Component Value Date   HMHIVSCREEN Negative - Validated 12/21/2021    Lab Results  Component Value Date   HIV NON-REACTIVE 08/23/2020   Last HEPC test per patient/review of record was  Lab Results  Component Value Date   HMHEPCSCREEN Negative-Validated 12/21/2021   No components found for: HEPC   Last HEPB test per patient/review of record was No components found for: HMHEPBSCREEN   Fertility: Does the patient or their partner desires a pregnancy in the next year? No  Immunization History  Administered Date(s) Administered   Hepatitis B, PED/ADOLESCENT 10/31/1997, 11/28/1997, 05/01/1998   MMR 02/03/1990   Td 01/15/2015    The following portions of the patient's history were reviewed and updated as appropriate: allergies, current medications, past medical history, past social history, past surgical history and problem list.  Objective:  There were no vitals filed for this visit.  Physical Exam Constitutional:      Appearance: Normal appearance.  HENT:     Head: Normocephalic.     Mouth/Throat:     Mouth: Mucous membranes are moist.  Eyes:  General: No scleral icterus.       Right eye: No discharge.        Left eye: No discharge.  Pulmonary:     Effort: Pulmonary effort is normal.  Skin:    General: Skin is warm and dry.  Neurological:     General: No focal deficit present.     Mental Status: He is alert.  Psychiatric:        Mood and Affect: Mood normal.        Behavior:  Behavior normal.    Assessment and Plan:  Harry Johnson is a 38 y.o. male presenting to the [facility] Department for STI screening  1. Chlamydia contact (Primary)  - Reports he had a sexual encounter outside his relationship, wanted to know if he could get treatment for his partner. Discussed that expedited partner therapy is not for situations where a person is a contact for the STI.  - Discussed need for his partner to seek out testing. - Discussed abstaining from sex until he is completes his medicine and his partner is tested and treated as needed. - doxycycline  (VIBRA -TABS) 100 MG tablet; Take 1 tablet (100 mg total) by mouth 2 (two) times daily for 7 days.  2. Screening for venereal disease  - Chlamydia/GC NAA, Confirmation   Patient does not have STI symptoms Patient accepted the following screenings: urine gonorrhea/chlamydia  Recommended condom use with all sex Discussed importance of condom use for STI prevention Discussed we will call for positive results. Recommended re-testing in 3 months for positive results.   Return for STI testing.  No future appointments.  Damien FORBES Satchel, NP

## 2023-09-16 NOTE — Progress Notes (Signed)
 Pt is here for STD testing And a contact to Chlamydia. The patient was dispensed Doxycycline  100 mg 2x/day for 7 days.I provided counseling today regarding the medication, the side effects and when to call clinic. Brochure given. Patient given the opportunity to ask questions for any clarifications. Questions answered. Wilkie Drought, RN.

## 2023-09-18 LAB — CHLAMYDIA/GC NAA, CONFIRMATION
Chlamydia trachomatis, NAA: NEGATIVE
Neisseria gonorrhoeae, NAA: NEGATIVE

## 2023-10-21 ENCOUNTER — Ambulatory Visit: Payer: Self-pay

## 2023-10-21 DIAGNOSIS — Z113 Encounter for screening for infections with a predominantly sexual mode of transmission: Secondary | ICD-10-CM

## 2023-10-21 LAB — HM HIV SCREENING LAB: HM HIV Screening: NEGATIVE

## 2023-10-21 NOTE — Progress Notes (Unsigned)
 Nurse visit only - patient declined full visit with provider. Orders placed for STI screening. No symptoms or contacts.

## 2023-10-21 NOTE — Progress Notes (Unsigned)
 [facility] Department STI clinic/screening visit  Subjective:  Harry Johnson is a 38 y.o. male being seen today for an [facility] The patient reports they do not have symptoms.    Patient has the following medical conditions:   Patient Active Problem List   Diagnosis Date Noted   Chlamydia contact 08/23/2020   Reactive depression 07/08/2019   Constipation 12/08/2018   SOB (shortness of breath) 07/07/2018   Fatigue 05/21/2018   Situational stress 05/21/2018   Chronic seasonal allergic rhinitis due to pollen     Chief Complaint  Patient presents with   Annual Exam   SEXUALLY TRANSMITTED DISEASE    Here for Screening     Last HIV test per patient/review of record was  Lab Results  Component Value Date   HMHIVSCREEN Negative - Validated 12/21/2021    Lab Results  Component Value Date   HIV NON-REACTIVE 08/23/2020    Does the patient or their partner desires a pregnancy in the next year? No  Screening for MPX risk: Does the patient have an unexplained rash? No Is the patient MSM? No Does the patient endorse multiple sex partners or anonymous sex partners? No Did the patient have close or sexual contact with a person diagnosed with MPX? No Has the patient traveled outside the [place]  where MPX is endemic? No Is there a high clinical suspicion for MPX-- evidenced by one of the following No  -Unlikely to be chickenpox  -Lymphadenopathy  -Rash that present in same phase of evolution on any given body part   See flowsheet for further details and programmatic requirements.   Immunization History  Administered Date(s) Administered   Hepatitis B, PED/ADOLESCENT 10/31/1997, 11/28/1997, 05/01/1998   MMR 02/03/1990   Td 01/15/2015     The following portions of the patient's history were reviewed and updated as appropriate: allergies, current medications, past medical history, past social history, past surgical history and problem  list.  Objective:  There were no vitals filed for this visit.  Patient seen by RN only. Self collected samples.  Assessment and Plan:  Harry Johnson is a 38 y.o. male presenting to the [facility] Department for [facility]I screening in [facility].  1. Screening for venereal disease (Primary)  - HIV [facility] - Chlamydia/GC NAA, Confirmation - Syphilis Serology, [facility]   Patient does not have STI symptoms Patient accepted all screenings including  urine GC/Chlamydia, and blood work for HIV/Syphilis. Patient meets criteria for HepB screening? No. Ordered? no Patient meets criteria for HepC screening? No. Ordered? no Recommended condom use with all sex Discussed importance of condom use for STI prevent  Treat positive test results per standing order. Discussed time line for State Lab results and that patient will be called with positive results and encouraged patient to call if he had not heard in 2 weeks Recommended repeat testing in 3 months with positive results.  No follow-ups on file.  No future appointments.  Kwadwo Alajia Schmelzer,RN.

## 2023-10-24 LAB — CHLAMYDIA/GC NAA, CONFIRMATION
Chlamydia trachomatis, NAA: NEGATIVE
Neisseria gonorrhoeae, NAA: NEGATIVE

## 2023-12-01 ENCOUNTER — Ambulatory Visit: Payer: Self-pay

## 2023-12-16 ENCOUNTER — Ambulatory Visit: Payer: Self-pay

## 2023-12-16 DIAGNOSIS — Z202 Contact with and (suspected) exposure to infections with a predominantly sexual mode of transmission: Secondary | ICD-10-CM

## 2023-12-16 DIAGNOSIS — Z113 Encounter for screening for infections with a predominantly sexual mode of transmission: Secondary | ICD-10-CM

## 2023-12-16 MED ORDER — DOXYCYCLINE HYCLATE 100 MG PO TABS: 100.0000 mg | ORAL_TABLET | Freq: Two times a day (BID) | ORAL | Status: AC

## 2023-12-16 NOTE — Patient Instructions (Signed)
 STI screening - Today we obtained a urine sample to screen for gonorrhea and chlamydia - If the results are abnormal, I will give you a call.    Estimated time frame for results collected at the [facility] Department: Same day Trichomonas Yeast BV (bacterial vaginosis)  Within 3-5 days Gonorrhea Chlamydia  Within 1 week HIV Syphilis Hepatitis B Hepatitis C

## 2023-12-16 NOTE — Progress Notes (Unsigned)
 [facility] Department STI clinic [address] [place] [address] Main phone: [phone]  STI screening visit  Subjective:  Styles Fambro is a 38 y.o. male being seen today for an STI screening visit. The patient reports they do not have symptoms.    Patient has the following medical conditions:  Patient Active Problem List   Diagnosis Date Noted   Chlamydia contact 08/23/2020   Reactive depression 07/08/2019   Constipation 12/08/2018   SOB (shortness of breath) 07/07/2018   Fatigue 05/21/2018   Situational stress 05/21/2018   Chronic seasonal allergic rhinitis due to pollen    Chief Complaint  Patient presents with   SEXUALLY TRANSMITTED DISEASE   HPI Patient reports desire for gonorrhea and chlamydia testing. States his current sexual partner was the one who exposed him to chlamydia in September; she was bad about taking her medication as prescribed and missed several doses. He would like to be re-treated as chlamydia contact. He reports a contact dermatitis rash x 1 week with a new body wash. After he switched soaps, it improved.   08/27/23: trichomonas contact --- given metronidazole  x 7 days --- negative G/C testing this day  09/16/23: chlamydia contact --- given doxycyline x 7 days --- negative G/C testing this day  10/21/23: asymptomatic STI testing --- negative HIV, syphilis, gonorrhea, and chlamydia this day  Reproductive considerations: Does the patient or their partner desires a pregnancy in the next year? No  See flowsheet for further details and programmatic requirements  Hyperlink available at the top of the signed note in blue.  Flow sheet content below:  Pregnancy Intention Screening Does the patient want to become pregnant in the next year?: N/A Does the patient's partner want to become pregnant in the next year?: No Would the patient like to discuss contraceptive options today?: N/A All  Patients Anyone smoke around pt and/or pt's children?: No Anyone smoke inside pt's house?: No Anyone smoke inside car?: No Anyone smoke inside the workplace?: No Reason For STD Screen STD Screening: Is asymptomatic Have you ever had an STD?: Yes STD Symptoms Denies all: Yes Risk Factors for Hep B Household, sexual, or needle sharing contact of a person infected with Hep B: No Sexual contact with a person who uses drugs not as prescribed?: No Currently or Ever used drugs not as prescribed: No HIV Positive: No PRep Patient: No Men who have sex with men: No Have Hepatitis C: No History of Incarceration: No History of Homeslessness?: No Anal sex following anal drug use?: N/A Risk Factors for Hep C Currently using drugs not as prescribed: No Sexual partner(s) currently using drugs as not prescribed: No History of drug use: No HIV Positive: No People with a history of incarceration: No People born between the years of [birth year] and [birth year]: No Abuse History Has patient ever been abused physically?: No Has patient ever been abused sexually?: No Does patient feel they have a problem with Anxiety?: No Does patient feel they have a problem with Depression?: No Referral to Behavioral Health: No Counseling Medication side effects discussed with patient?: Yes Patient counseled to abstain from sex for: 14 days Patient counseled to abstain from sex for?: 7 days post partner's treatment Patient counseled to avoid alcohol for?: 10 days Patient counseled to use condoms with all sex: Condoms given RTC in 2-3 weeks for test results: Yes Clinic will call if test results abnormal before test result appt.: Yes Patient should return to the clinic for re-treatment  if vomits within 2 hours after taking meds   : Yes Immunizations: Declined Test results given to patient Patient counseled to use condoms with all sex: Condoms given STD Treatment Patient in to clinic for treatment of: Chlamydia Patient  counseled to abstain from sex for: 14 days Patient counseled to abstain from sex for?: 7 days post partner's treatment Patient counseled to avoid alcohol for?: 10 days Nursing Actions: Patient treated per S.O., Patient should RTC for re-treatment if vomits within 2 hours after taking meds., Patient counseled to use condoms with all sex - Condoms given  Screening for MPX risk:  Unexplained rash?  No   MSM?  No   Multiple or anonymous sex partners?  No   Any close or sexual contact with a person  diagnosed with MPX?  No   Any outside the [place]  where MPX is endemic?  No   High clinical suspicion for MPX?    -Unlikely to be chickenpox    -Lymphadenopathy    -Rash that presents in same phase of       evolution on any given body part  No   Does this patient meet CDC recommendations for vaccination against MPOX? No  You already have or anticipate having the following risks:  Your sex partner has the following risks: You're traveling to a county with a clade I MPOX outbreak and anticipate these risks: Occupational exposure  You had known or suspected exposure to someone with monkeypox You had a sex partner in the past 2 weeks who was diagnosed with monkeypox You are a gay, bisexual, or other man who has sex with men, or are transgender or nonbinary and in the past 6 months have had any of the following: - A new diagnosis of one or more sexually transmitted diseases (e.g., chlamydia, gonorrhea, or syphilis) - More than one sex partner You have had any of the following in the past 6 months: - Sex at a commercial sex venue (like a sex club or bathhouse) - Sex related to a large commercial event   or in a geographic area (city or county for example) where mpox virus transmission is occurring Sex with a new partner Sex at a commercial sex venue (e.g., a sex club or bathhouse) Sex in it consultant for money, goods, drugs, or other trade Sex in association with a large public event (e.g., a rave, party, or  festival) i.e. certain people who work in a laboratory or healthcare facility   Infectious disease screenings: Vaccinated against HPV? Unknown  HIV Ever had a positive? No Results in chart:  Lab Results  Component Value Date   HMHIVSCREEN Negative - Validated 10/21/2023    Lab Results  Component Value Date   HIV NON-REACTIVE 08/23/2020    Hep B Hep B status: unknown or no prior testing Received HBV vaccination? Yes Received HBV testing for immunity? No Results in chart:  No components found for: HMHEPBSCREEN  Do they qualify for HBV screening today? No  Hep C Hep C status: negative on 12/2021 Results in chart:  Lab Results  Component Value Date   HMHEPCSCREEN Negative-Validated 12/21/2021   No components found for: HEPC  Do they qualify for HCV screening today? No  Immunization history:  Immunization History  Administered Date(s) Administered   Hepatitis B, PED/ADOLESCENT 10/31/1997, 11/28/1997, 05/01/1998   MMR 02/03/1990   Td 01/15/2015    The following portions of the patient's history were reviewed and updated as appropriate: allergies, current medications, past medical history, past  social history, past surgical history and problem list.  Substance use screenings:  Uses tobacco products? No Uses vapes? No Uses alcohol? No Uses non-injectable substances that alter your mental status? No Uses non-prescribed injectable substances? No  Immunization History  Administered Date(s) Administered   Hepatitis B, PED/ADOLESCENT 10/31/1997, 11/28/1997, 05/01/1998   MMR 02/03/1990   Td 01/15/2015    The following portions of the patient's history were reviewed and updated as appropriate: allergies, current medications, past medical history, past social history, past surgical history and problem list.  Objective:  There were no vitals filed for this visit.  Physical Exam Vitals and nursing note reviewed. Chaperone present: declines chaperone.  Constitutional:       Appearance: Normal appearance.  HENT:     Head: Normocephalic and atraumatic.     Comments: No nits or hair loss of scalp, brows, and lashes    Mouth/Throat:     Mouth: Mucous membranes are moist. No oral lesions.     Pharynx: Oropharynx is clear. No oropharyngeal exudate or posterior oropharyngeal erythema.  Eyes:     General:        Right eye: No discharge.        Left eye: No discharge.     Conjunctiva/sclera: Conjunctivae normal.     Right eye: Right conjunctiva is not injected. No exudate.    Left eye: Left conjunctiva is not injected. No exudate. Pulmonary:     Effort: Pulmonary effort is normal.  Abdominal:     General: Abdomen is flat.     Palpations: There is no hepatomegaly.     Tenderness: There is no abdominal tenderness.  Genitourinary:    Pubic Area: No rash or pubic lice (no nits).      Penis: Normal. No discharge, swelling or lesions.      Epididymis:     Right: Not enlarged.     Left: Not enlarged.  Lymphadenopathy:     Cervical: No cervical adenopathy.     Upper Body:     Right upper body: No supraclavicular, axillary or epitrochlear adenopathy.     Left upper body: No supraclavicular, axillary or epitrochlear adenopathy.     Lower Body: No right inguinal adenopathy. No left inguinal adenopathy.  Skin:    General: Skin is warm and dry.     Findings: Rash (faint skin colored raised rash over flexor surfaces and inguinal skin only) present. No lesion.  Neurological:     Mental Status: He is alert and oriented to person, place, and time.  Psychiatric:        Mood and Affect: Mood normal.        Behavior: Behavior normal.    Assessment and Plan:  Rawley Harju is a 38 y.o. male presenting to the [facility] Department for STI screening.  Patient accepted the following screenings: urine CT/GC  Screening examination for venereal disease -     Chlamydia/GC NAA, Confirmation  Chlamydia contact -     Doxycycline  Hyclate; Take 1  tablet (100 mg total) by mouth 2 (two) times daily for 7 days.  Counseling: Recommended condom use with all sex Discussed importance of condom use for STI prevention Discussed time line for State Lab results and that patient will be called with positive results and encouraged patient to call if they had not heard in 2 weeks Recommended repeat testing in 3 months with positive results. Recommended returning for continued or worsening symptoms.   Return if symptoms worsen or fail to  improve, for STI testing.  No future appointments.  Betsey CHRISTELLA Helling, MD

## 2023-12-16 NOTE — Progress Notes (Signed)
 Pt is here for STD screening and Contact to chlamydia. The patient was dispensed doxycyline 100 mg capsules 2x/day for 7 days. I provided counseling today regarding the medication, the side effects and when to call clinic. Patient was given the opportunity to ask questions for any clarifications. Questions answered. Condoms given. Wilkie Drought, RN

## 2023-12-18 LAB — CHLAMYDIA/GC NAA, CONFIRMATION
Chlamydia trachomatis, NAA: NEGATIVE
Neisseria gonorrhoeae, NAA: NEGATIVE

## 2024-01-14 ENCOUNTER — Encounter (HOSPITAL_BASED_OUTPATIENT_CLINIC_OR_DEPARTMENT_OTHER): Payer: Self-pay

## 2024-01-14 ENCOUNTER — Emergency Department (HOSPITAL_BASED_OUTPATIENT_CLINIC_OR_DEPARTMENT_OTHER)
Admission: EM | Admit: 2024-01-14 | Discharge: 2024-01-14 | Disposition: A | Discharge status: Home / Self Care | Payer: Self-pay | Attending: Emergency Medicine | Admitting: Emergency Medicine

## 2024-01-14 ENCOUNTER — Emergency Department (HOSPITAL_BASED_OUTPATIENT_CLINIC_OR_DEPARTMENT_OTHER): Payer: Self-pay | Admitting: Radiology

## 2024-01-14 ENCOUNTER — Other Ambulatory Visit: Payer: Self-pay

## 2024-01-14 DIAGNOSIS — R079 Chest pain, unspecified: Secondary | ICD-10-CM

## 2024-01-14 DIAGNOSIS — R0789 Other chest pain: Secondary | ICD-10-CM | POA: Insufficient documentation

## 2024-01-14 LAB — BASIC METABOLIC PANEL WITH GFR
Anion gap: 12 (ref 5–15)
BUN: 14 mg/dL (ref 6–20)
CO2: 25 mmol/L (ref 22–32)
Calcium: 9.8 mg/dL (ref 8.9–10.3)
Chloride: 105 mmol/L (ref 98–111)
Creatinine, Ser: 1.03 mg/dL (ref 0.61–1.24)
GFR, Estimated: >60 mL/min
Glucose, Bld: 83 mg/dL (ref 70–99)
Potassium: 3.9 mmol/L (ref 3.5–5.1)
Sodium: 141 mmol/L (ref 135–145)

## 2024-01-14 LAB — CBC
HCT: 44.4 % (ref 39.0–52.0)
Hemoglobin: 14.9 g/dL (ref 13.0–17.0)
MCH: 29.3 pg (ref 26.0–34.0)
MCHC: 33.6 g/dL (ref 30.0–36.0)
MCV: 87.2 fL (ref 80.0–100.0)
Platelets: 255 K/uL (ref 150–400)
RBC: 5.09 MIL/uL (ref 4.22–5.81)
RDW: 13.2 % (ref 11.5–15.5)
WBC: 5.7 K/uL (ref 4.0–10.5)
nRBC: 0 % (ref 0.0–0.2)

## 2024-01-14 LAB — TROPONIN T, HIGH SENSITIVITY
Troponin T High Sensitivity: <15 ng/L (ref 0–19)
Troponin T High Sensitivity: <15 ng/L (ref 0–19)

## 2024-01-14 MED ORDER — PANTOPRAZOLE SODIUM 20 MG PO TBEC: 20.0000 mg | DELAYED_RELEASE_TABLET | Freq: Two times a day (BID) | ORAL | 0 refills | Status: AC

## 2024-01-14 NOTE — ED Triage Notes (Signed)
 Pt reports generalized cp for several weeks. Pt also reports a toothache x1 month. Pt reports being concerned after reading online that a toothache can lead to a heart attack.

## 2024-01-14 NOTE — ED Provider Notes (Signed)
 " [place] EMERGENCY DEPARTMENT AT [facility] Provider Note   CSN: [id number] Arrival date & time: 01/14/24  9147     Patient presents with: Chest Pain   Harry Johnson is a 38 y.o. male.    Chest Pain    Patient presented to the ED for evaluation of chest pain.  Patient states he has been having symptoms ongoing for several weeks.  He has had some discomfort in his chest.  Nothing seems to be making it particularly worse or better.  He was at work today and he had another episode of the chest comfort so he came to the ED for evaluation.  He is not having fevers no abdominal pain.  No leg swelling  Prior to Admission medications  Medication Sig Start Date End Date Taking? Authorizing Provider  pantoprazole (PROTONIX) 20 MG tablet Take 1 tablet (20 mg total) by mouth 2 (two) times daily. 01/14/24 01/21/24 Yes Randol Thom, MD  baclofen  (LIORESAL ) 10 MG tablet Take 1 tablet (10 mg total) by mouth 3 (three) times daily. Patient not taking: Reported on 08/27/2023 07/14/23   Reddick, Johnathan B, NP  cetirizine (ZYRTEC) 10 MG tablet Take 10 mg by mouth daily. Patient not taking: Reported on 01/21/2023    [provider]  diclofenac  (VOLTAREN ) 50 MG EC tablet Take 1 tablet (50 mg total) by mouth 2 (two) times daily. Patient not taking: Reported on 08/27/2023 07/14/23   Reddick, Johnathan B, NP  HYDROcodone -acetaminophen  (NORCO/VICODIN) 5-325 MG tablet Take 1 tablet by mouth every 6 (six) hours as needed for moderate pain. Patient not taking: Reported on 01/21/2023 02/03/21   Eudelia Maude SAUNDERS, PA-C  ondansetron  (ZOFRAN -ODT) 4 MG disintegrating tablet Take 1 tablet (4 mg total) by mouth every 8 (eight) hours as needed for nausea or vomiting. Patient not taking: Reported on 01/21/2023 02/03/21   Zackowski, Scott, MD    Allergies: Patient has no known allergies.    Review of Systems  Cardiovascular:  Positive for chest pain.    Updated Vital Signs BP 120/78   Pulse 74    Temp 98.5 F (36.9 C) (Oral)   Resp 20   Ht 1.727 m (5' 8)   Wt 89.8 kg   SpO2 100%   BMI 30.11 kg/m   Physical Exam Vitals and nursing note reviewed.  Constitutional:      General: He is not in acute distress.    Appearance: He is well-developed.  HENT:     Head: Normocephalic and atraumatic.     Right Ear: External ear normal.     Left Ear: External ear normal.  Eyes:     General: No scleral icterus.       Right eye: No discharge.        Left eye: No discharge.     Conjunctiva/sclera: Conjunctivae normal.  Neck:     Trachea: No tracheal deviation.  Cardiovascular:     Rate and Rhythm: Normal rate and regular rhythm.  Pulmonary:     Effort: Pulmonary effort is normal. No respiratory distress.     Breath sounds: Normal breath sounds. No stridor. No wheezing or rales.  Abdominal:     General: Bowel sounds are normal. There is no distension.     Palpations: Abdomen is soft.     Tenderness: There is no abdominal tenderness. There is no guarding or rebound.  Musculoskeletal:        General: No tenderness or deformity.     Cervical back: Neck supple.  Skin:    General: Skin is warm and dry.     Findings: No rash.  Neurological:     General: No focal deficit present.     Mental Status: He is alert.     Cranial Nerves: No cranial nerve deficit, dysarthria or facial asymmetry.     Sensory: No sensory deficit.     Motor: No abnormal muscle tone or seizure activity.     Coordination: Coordination normal.  Psychiatric:        Mood and Affect: Mood normal.     (all labs ordered are listed, but only abnormal results are displayed) Labs Reviewed  BASIC METABOLIC PANEL WITH GFR  CBC  TROPONIN T, HIGH SENSITIVITY  TROPONIN T, HIGH SENSITIVITY    EKG: EKG Interpretation Date/Time:  Wednesday January 14 2024 08:56:36 EST Ventricular Rate:  85 PR Interval:  168 QRS Duration:  86 QT Interval:  359 QTC Calculation: 427 R Axis:   58  Text Interpretation: Sinus rhythm  Borderline ST elevation, anterior leads Baseline wander in lead(s) V2 No significant change since last tracing Reconfirmed by Randol Simmonds [phone]) on 01/14/2024 12:12:57 PM  Radiology: DG Chest 2 View Result Date: 01/14/2024 CLINICAL DATA:  Chest pain. EXAM: CHEST - 2 VIEW COMPARISON:  None Available. FINDINGS: The cardiomediastinal contours are normal. The lungs are clear. Pulmonary vasculature is normal. No consolidation, pleural effusion, or pneumothorax. No acute osseous abnormalities are seen. IMPRESSION: No active cardiopulmonary disease. Electronically Signed   By: Andrea Gasman M.D.   On: 01/14/2024 09:48     Procedures   Medications Ordered in the ED - No data to display  Clinical Course as of 01/14/24 1214  Wed Jan 14, 2024  1148 Troponin T, High Sensitivity Troponin unchanged [JK]    Clinical Course User Index [JK] Randol Simmonds, MD                                 Medical Decision Making Problems Addressed: Chest pain, unspecified type: acute illness or injury  Amount and/or Complexity of Data Reviewed Labs: ordered. Decision-making details documented in ED Course. Radiology: ordered and independent interpretation performed.  Risk Prescription drug management.   Patient presented to the ED for evaluation of chest pain.  Patient overall low risk for heart disease.  Patient does have abnormalities in EKG with some T wave inversion inferiorly however this is unchanged compared to prior EKG back in 2003.  Patient serial troponins are normal.  Overall low suspicion for ACS.  Patient appears comfortable in the ED.  Doubt aortic dissection acute coronary syndrome pulmonary embolism or other emergent etiology.  Symptoms unclear but he does appear appropriate for discharge and close outpatient evaluation.     Final diagnoses:  Chest pain, unspecified type    ED Discharge Orders          Ordered    pantoprazole (PROTONIX) 20 MG tablet  2 times daily        01/14/24  1204    Ambulatory referral to Cardiology       Comments: If you have not heard from the Cardiology office within the next 72 hours please call [phone].   01/14/24 1214               Randol Simmonds, MD 01/14/24 1214  "

## 2024-01-14 NOTE — Discharge Instructions (Signed)
 The test today in the ED did not show any signs of serious problem.  Start taking the antacid medication to see if that helps with your symptoms.  Follow-up with your primary care doctor as we discussed to be rechecked.

## 2024-01-14 NOTE — ED Notes (Signed)
 ED Provider at bedside.

## 2024-01-29 ENCOUNTER — Ambulatory Visit: Attending: Physician Assistant | Admitting: Physician Assistant
